# Patient Record
Sex: Male | Born: 1970
Health system: Southern US, Community
[De-identification: ages and names within clinical notes are randomized; demographics above are authoritative.]

## PROBLEM LIST (undated history)

## (undated) DIAGNOSIS — G4730 Sleep apnea, unspecified: Secondary | ICD-10-CM

## (undated) DIAGNOSIS — I10 Essential (primary) hypertension: Secondary | ICD-10-CM

## (undated) DIAGNOSIS — K219 Gastro-esophageal reflux disease without esophagitis: Secondary | ICD-10-CM

## (undated) DIAGNOSIS — B019 Varicella without complication: Secondary | ICD-10-CM

## (undated) DIAGNOSIS — M1990 Unspecified osteoarthritis, unspecified site: Secondary | ICD-10-CM

## (undated) DIAGNOSIS — N189 Chronic kidney disease, unspecified: Secondary | ICD-10-CM

## (undated) DIAGNOSIS — E119 Type 2 diabetes mellitus without complications: Secondary | ICD-10-CM

## (undated) DIAGNOSIS — E785 Hyperlipidemia, unspecified: Secondary | ICD-10-CM

## (undated) HISTORY — DX: Sleep apnea, unspecified: G47.30

## (undated) HISTORY — DX: Varicella without complication: B01.9

## (undated) HISTORY — DX: Chronic kidney disease, unspecified: N18.9

## (undated) HISTORY — PX: DENTAL SURGERY: SHX609

## (undated) HISTORY — DX: Unspecified osteoarthritis, unspecified site: M19.90

## (undated) HISTORY — DX: Gastro-esophageal reflux disease without esophagitis: K21.9

## (undated) HISTORY — DX: Type 2 diabetes mellitus without complications: E11.9

## (undated) HISTORY — DX: Hyperlipidemia, unspecified: E78.5

---

## 2000-10-29 ENCOUNTER — Encounter: Payer: Self-pay | Admitting: Occupational Medicine

## 2000-10-29 ENCOUNTER — Encounter: Admission: RE | Admit: 2000-10-29 | Discharge: 2000-10-29 | Payer: Self-pay | Admitting: Occupational Medicine

## 2004-05-02 ENCOUNTER — Ambulatory Visit (HOSPITAL_COMMUNITY): Admission: RE | Admit: 2004-05-02 | Discharge: 2004-05-02 | Payer: Self-pay | Admitting: Internal Medicine

## 2004-05-24 ENCOUNTER — Ambulatory Visit: Admission: RE | Admit: 2004-05-24 | Discharge: 2004-05-24 | Payer: Self-pay | Admitting: Internal Medicine

## 2004-05-24 ENCOUNTER — Encounter (INDEPENDENT_AMBULATORY_CARE_PROVIDER_SITE_OTHER): Payer: Self-pay | Admitting: *Deleted

## 2006-03-23 ENCOUNTER — Emergency Department (HOSPITAL_COMMUNITY): Admission: EM | Admit: 2006-03-23 | Discharge: 2006-03-23 | Payer: Self-pay | Admitting: Emergency Medicine

## 2006-09-20 ENCOUNTER — Emergency Department (HOSPITAL_COMMUNITY): Admission: EM | Admit: 2006-09-20 | Discharge: 2006-09-20 | Payer: Self-pay | Admitting: Emergency Medicine

## 2009-06-07 ENCOUNTER — Emergency Department (HOSPITAL_COMMUNITY): Admission: EM | Admit: 2009-06-07 | Discharge: 2009-06-07 | Payer: Self-pay | Admitting: Family Medicine

## 2011-02-01 NOTE — Op Note (Signed)
NAME:  Bryan Webb, Bryan Webb                          ACCOUNT NO.:  [id number]   MEDICAL RECORD NO.:  [id number]                   PATIENT TYPE:  OUT   LOCATION:  CARD                                 FACILITY:  [facility]   PHYSICIAN:  Legrand Como B. Melvyn Novas, M.D. [facility]           DATE OF BIRTH:  [birth year]   DATE OF PROCEDURE:  05/24/2004  DATE OF DISCHARGE:                                 OPERATIVE REPORT   REFERRING PHYSICIAN:  Dr. Shawna Orleans   PROCEDURE:  Fiberoptic bronchoscopy with random transbronchial biopsies of  the left lower lobe.   HISTORY AND INDICATIONS:  This is a 40 year old black male with a 3-4 month  history of cough associated with classic sarcoid changes on chest x-ray.  Bronchoscopy was felt to be indicated to obtain a tissue diagnosis if  possible before initiating prednisone therapy.  The patient agreed to the  procedure after full discussion of risks, benefits, and alternatives.   The procedure was performed in the bronchoscopy suite with continuous  monitoring by surface ECG and oximetry.  The patient maintained adequate  saturations throughout the procedure in sinus rhythm.  He received a total  of 100 mg of IV Demerol and 7.5 mg of IV Versed for adequate sedation and  cough suppression.   The right naris and oropharynx were liberally anesthetized with 1% lidocaine  by updraft nebulizer and the right naris additionally prepared with 2%  lidocaine jelly.  However, had a great deal of difficulty controlling the  coughing from the upper airway, even with additional direct administration  of lidocaine onto the epiglottis and vocal cords via a standard flexible  fiberoptic bronchoscope.  Nevertheless, we were able to pass the scope into  the airways with the following findings:  There was diffuse mild to moderate  cobblestoning and erythema of the mucosa, typical of sarcoid.  This was  worse in the upper lobes than the lower lobes, worse on the left than the  right.   DESCRIPTION OF  PROCEDURE:  Using a standard forceps technique and a wedged  position, 6 adequate transbronchial biopsies were obtained from the left  lower lobe randomly.  Adequate tissue was obtained.  Additionally, the  lingula was lavaged for AFB and fungal stain and culture as well as PCP to  be complete.   The patient tolerated the procedure well and had a follow up chest x-ray.   IMPRESSION:  Airway changes are classic for sarcoid which is the most likely  diagnosis here.   RECOMMENDATIONS:  Await tissue confirmation.   Because he had such severe coughing, both before and after the procedure, I  recommend a single dose of Solu-Medrol 125 mg IV but will arrange to see him  in the office within the next week to consider initiation of long-term  prednisone therapy.  Christena Deem. Melvyn Novas, M.D. [facility]   MBW/MEDQ  D:  05/24/2004  T:  05/24/2004  Job:  [id number]   cc:   Drema Pry, D.O. [facility]

## 2011-05-19 ENCOUNTER — Inpatient Hospital Stay (INDEPENDENT_AMBULATORY_CARE_PROVIDER_SITE_OTHER)
Admission: RE | Admit: 2011-05-19 | Discharge: 2011-05-19 | Disposition: A | Discharge status: Home / Self Care | Payer: BC Managed Care – PPO | Source: Ambulatory Visit | Attending: Family Medicine | Admitting: Family Medicine

## 2011-05-19 DIAGNOSIS — M67919 Unspecified disorder of synovium and tendon, unspecified shoulder: Secondary | ICD-10-CM

## 2011-05-19 DIAGNOSIS — M719 Bursopathy, unspecified: Secondary | ICD-10-CM

## 2012-01-06 ENCOUNTER — Emergency Department (INDEPENDENT_AMBULATORY_CARE_PROVIDER_SITE_OTHER)
Admission: EM | Admit: 2012-01-06 | Discharge: 2012-01-06 | Disposition: A | Discharge status: Home / Self Care | Payer: BC Managed Care – PPO | Source: Home / Self Care | Attending: Emergency Medicine | Admitting: Emergency Medicine

## 2012-01-06 ENCOUNTER — Encounter (HOSPITAL_COMMUNITY): Payer: Self-pay | Admitting: Emergency Medicine

## 2012-01-06 DIAGNOSIS — I10 Essential (primary) hypertension: Secondary | ICD-10-CM

## 2012-01-06 HISTORY — DX: Essential (primary) hypertension: I10

## 2012-01-06 LAB — POCT I-STAT, CHEM 8
BUN: 10 mg/dL (ref 6–23)
Calcium, Ion: 1.26 mmol/L (ref 1.12–1.32)
Chloride: 103 mEq/L (ref 96–112)
Creatinine, Ser: 1.4 mg/dL — ABNORMAL HIGH (ref 0.50–1.35)
Glucose, Bld: 117 mg/dL — ABNORMAL HIGH (ref 70–99)
HCT: 45 % (ref 39.0–52.0)
Hemoglobin: 15.3 g/dL (ref 13.0–17.0)
Potassium: 4.4 mEq/L (ref 3.5–5.1)
Sodium: 139 mEq/L (ref 135–145)
TCO2: 28 mmol/L (ref 0–100)

## 2012-01-06 MED ORDER — AMLODIPINE BESYLATE 10 MG PO TABS: 5.0000 mg | ORAL_TABLET | Freq: Every day | ORAL | Status: DC

## 2012-01-06 NOTE — ED Notes (Signed)
PT HERE WITH C/O CONSTANT FRONTAL H/A WITH FLUCTUATING BP'S FOR THE PAST 3 DYS AT HOME READING 176/98,158/76, AND 150/83.DENIES N/V/ OR FEELING FAINT.PT STATES HE HAS NEVER BEEN ON BP MEDS AND NEEDS REFERRAL FOR PCP.BP THIS ADMISSION 143/93

## 2012-01-06 NOTE — Discharge Instructions (Signed)
Call [phone] for primary care referral.  Blood pressure over the ideal can put you at higher risk for stroke, heart disease, and kidney failure.  For this reason, it's important to try to get your blood pressure as close as possible to the ideal.  The ideal blood pressure is 120/80.  Blood pressures from 123456 systolic over [id number] diastolic are labeled as "prehypertension."  This means you are at higher risk of developing hypertension in the future.  Blood pressures in this range are not treated with medication, but lifestyle changes are recommended to prevent progression to hypertension.  Blood pressures of [id number] and above systolic over 90 and above diastolic are classified as hypertension and are treated with medications.  Lifestyle changes which can benefit both prehypertension and hypertension include the following:   Salt and sodium restriction.  Weight loss.  Regular exercise.  Avoidance of tobacco.  Avoidance of excess alcohol.  The "D.A.S.H" diet.   People with hypertension and prehypertension should limit their salt intake to less than 1500 mg daily.  Reading the nutrition information on the label of many prepared foods can give you an idea of how much sodium you're consuming at each meal.  Remember that the most important number on the nutrition information is the serving size.  It may be smaller than you think.  Try to avoid adding extra salt at the table.  You may add small amounts of salt while cooking.  Remember that salt is an acquired taste and you may get used to a using a whole lot less salt than you are using now.  Using less salt lets the food's natural flavors come through.  You might want to consider using salt substitutes, potassium chloride, pepper, or blends of herbs and spices to enhance the flavor of your food.  Foods that contain the most salt include: processed meats (like ham, bacon, lunch meat, sausage, hot dogs, and breakfast meat), chips, pretzels, salted nuts,  soups, salty snacks, canned foods, junk food, fast food, restaurant food, mustard, pickles, pizza, popcorn, soy sauce, and worcestershire sauce--quite a list!  You might ask, "Is there anything I can eat?"  The answer is, "yes."  Fruits and vegetables are usually low in salt.  Fresh is better than frozen which is better than canned.  If you have canned vegetables, you can cut down on the salt content by rinsing them in tap water 3 times before cooking.     Weight loss is the second thing you can do to lower your blood pressure.  Getting to and maintaining ideal weight will often normalize your blood pressure and allow you to avoid medications, entirely, cut way down on your dosage of medications, or allow to wean off your meds.  (Note, this should only be done under the supervision of your primary care doctor.)  Of course, weight loss takes time and you may need to be on medication in the meantime.  You shoot for a body mass index of 20-25.  When you go to the urgent care or to your primary care doctor, they should calculate your BMI.  If you don't know what it is, ask.  You can calculate your BMI with the following formula:  Weight in pounds x 703/ (height in inches) x (height in inches).  There are many good diets out there: Weight Watchers and the D.A.S.H. Diet are the best, but often, just modifying a few factors can be helpful:  Don't skip meals, don't eat out, and keeping a  food diary.  I do not recommend fad diets or diet pills which often raise blood pressure.    Everyone should get regular exercise, but this is particularly important for people with high blood pressure.  Just about any exercise is good.  The only exercise which may be harmful is lifting extreme heavy weights.  I recommend moderate exercise such as walking for 30 minutes 5 days a week.  Going to the gym for a 50 minute workout 3 times a week is also good.  This amounts to 150 minutes of exercise weekly.   Anyone with high blood  pressure should avoid any use of tobacco.  Tobacco use does not elevate blood pressure, but it increases the risk of heart disease and stroke.  If you are interested in quitting, discuss with your doctor how to quit.  If you are not interested in quitting, ask yourself, "What would my life be like in 10 years if I continue to smoke?"  "How will I know when it is time to quit?"  "How would my life be better if I were to quit."   Excess alcohol intake can raise the blood pressure.  The safe alcohol intake is 2 drinks or less per day for men and 1 drink per day or less for women.   There is a very good diet which I recommend that has been designed for people with blood pressure called the D.A.S.H. Diet (dietary approaches to stop hypertension).  It consists of fruits, vegetables, lean meats, low fat dairy, whole grains, nuts and seeds.  It is very low in salt and sodium.  It has also been found to have other beneficial health effects such as lowering cholesterol and helping lose weight.  It has been developed by the [employer] and can be downloaded from the internet without any cost. Just do a Development worker, community on "D.A.S.H. Diet." or go the NIH website (MasterBoxes.it).  There are also cookbooks and diet plans that can be gotten from [place] to help you with this diet.

## 2012-01-06 NOTE — ED Provider Notes (Signed)
Chief Complaint  Patient presents with  . Blood Pressure Check    History of Present Illness:  Bryan Webb is a 41 year old male who comes in today because of high blood pressure. He's had his blood pressure checked multiple times throughout the past several years because there is a positive family history. So his been somewhat elevated. Over the past 3 days his blood pressure was 176/98, 150/83, and 161/91 respectively. He denies headaches, dizziness, blurry vision, chest pain, tightness, pressure, palpitations, presyncope, syncope, or shortness of breath. He's had no pedal edema or history of diabetes, elevated cholesterol, or kidney disease. He tries to watch his salt intake. He currently smokes daily, but will plan to quit.  Review of Systems:  Other than noted above, the patient denies any of the following symptoms: Systemic:  No fever, chills, sweats, fatigue, or weight loss or gain. Eye:  No diplopia or blurry vision. Lungs:  No cough, wheezing, or shortness of breath. Heart:  No chest pain, tightness, pressure, palpitations, rapid heartbeat, dizziness, syncope, ankle edema, PND or orthopnea. Neuro:  No headache, vertigo, paresthesias, weakness, ataxia, or slurred speech.  [place]:  Past medical history, family history, social history, meds, and allergies were reviewed.  Physical Exam:   Vital signs:  BP 143/93  Pulse 90  Temp(Src) 98.7 F (37.1 C) (Oral)  Resp 14  SpO2 100% General:  Alert, oriented, in no distress. Lungs:  Breath sounds clear and equal bilaterally.  No wheezes, rales or rhonchi. Heart:  Regular rhythm, no gallops, murmers, clicks or rubs. Abdomen:  Soft and flat  Labs:   Results for orders placed during the hospital encounter of 01/06/12  POCT I-STAT, CHEM 8      Component Value Range   Sodium 139  135 - 145 (mEq/L)   Potassium 4.4  3.5 - 5.1 (mEq/L)   Chloride 103  96 - 112 (mEq/L)   BUN 10  6 - 23 (mg/dL)   Creatinine, Ser 1.40 (*) 0.50 - 1.35 (mg/dL)   Glucose, Bld 117 (*) 70 - 99 (mg/dL)   Calcium, Ion 1.26  1.12 - 1.32 (mmol/L)   TCO2 28  0 - 100 (mmol/L)   Hemoglobin 15.3  13.0 - 17.0 (g/dL)   HCT 45.0  39.0 - 52.0 (%)     Assessment:   Diagnoses that have been ruled out:  None  Diagnoses that are still under consideration:  None  Final diagnoses:  Essential hypertension    Plan:   1.  The following meds were prescribed:   New Prescriptions   AMLODIPINE (NORVASC) 10 MG TABLET    Take 0.5 tablets (5 mg total) by mouth daily.   2.  The patient was instructed in symptomatic care and handouts were given. 3.  The patient was told to return if becoming worse in any way, if no better in 3 or 4 days, and given some red flag symptoms that would indicate earlier return. I discussed salt and sodium restriction and also smoking cessation with him. I also informed him about his creatinine being 1.4 and told him he would need a followup for this. I suggested he follow up with her primary care physician within the next 2 weeks.    Harden Mo, MD 01/06/12 [phone]

## 2016-08-12 ENCOUNTER — Encounter (HOSPITAL_COMMUNITY): Payer: Self-pay | Admitting: Emergency Medicine

## 2016-08-12 ENCOUNTER — Emergency Department (HOSPITAL_COMMUNITY)
Admission: EM | Admit: 2016-08-12 | Discharge: 2016-08-13 | Disposition: A | Discharge status: Home / Self Care | Payer: BLUE CROSS/BLUE SHIELD | Attending: Emergency Medicine | Admitting: Emergency Medicine

## 2016-08-12 DIAGNOSIS — F17200 Nicotine dependence, unspecified, uncomplicated: Secondary | ICD-10-CM | POA: Insufficient documentation

## 2016-08-12 DIAGNOSIS — Z794 Long term (current) use of insulin: Secondary | ICD-10-CM | POA: Diagnosis not present

## 2016-08-12 DIAGNOSIS — R739 Hyperglycemia, unspecified: Secondary | ICD-10-CM

## 2016-08-12 DIAGNOSIS — I10 Essential (primary) hypertension: Secondary | ICD-10-CM | POA: Insufficient documentation

## 2016-08-12 DIAGNOSIS — E1165 Type 2 diabetes mellitus with hyperglycemia: Secondary | ICD-10-CM | POA: Diagnosis not present

## 2016-08-12 LAB — URINALYSIS, ROUTINE W REFLEX MICROSCOPIC
Bilirubin Urine: NEGATIVE
Hgb urine dipstick: NEGATIVE
KETONES UR: NEGATIVE mg/dL
LEUKOCYTES UA: NEGATIVE
Nitrite: NEGATIVE
PROTEIN: NEGATIVE mg/dL
Specific Gravity, Urine: 1.037 — ABNORMAL HIGH (ref 1.005–1.030)
pH: 5.5 (ref 5.0–8.0)

## 2016-08-12 LAB — BASIC METABOLIC PANEL
Anion gap: 8 (ref 5–15)
BUN: 22 mg/dL — AB (ref 6–20)
CALCIUM: 9.8 mg/dL (ref 8.9–10.3)
CO2: 27 mmol/L (ref 22–32)
CREATININE: 1.51 mg/dL — AB (ref 0.61–1.24)
Chloride: 95 mmol/L — ABNORMAL LOW (ref 101–111)
GFR, EST NON AFRICAN AMERICAN: 54 mL/min — AB (ref >60)
Glucose, Bld: 606 mg/dL (ref 65–99)
Potassium: 4.5 mmol/L (ref 3.5–5.1)
SODIUM: 130 mmol/L — AB (ref 135–145)

## 2016-08-12 LAB — CBG MONITORING, ED
GLUCOSE-CAPILLARY: 429 mg/dL — AB (ref 65–99)
GLUCOSE-CAPILLARY: 255 mg/dL — AB (ref 65–99)
Glucose-Capillary: 374 mg/dL — ABNORMAL HIGH (ref 65–99)

## 2016-08-12 LAB — URINE MICROSCOPIC-ADD ON
Bacteria, UA: NONE SEEN
RBC / HPF: NONE SEEN RBC/hpf (ref 0–5)
Squamous Epithelial / LPF: NONE SEEN
WBC UA: NONE SEEN WBC/hpf (ref 0–5)

## 2016-08-12 LAB — CBC
HCT: 40.1 % (ref 39.0–52.0)
Hemoglobin: 13.8 g/dL (ref 13.0–17.0)
MCH: 30.7 pg (ref 26.0–34.0)
MCHC: 34.4 g/dL (ref 30.0–36.0)
MCV: 89.3 fL (ref 78.0–100.0)
PLATELETS: 244 10*3/uL (ref 150–400)
RBC: 4.49 MIL/uL (ref 4.22–5.81)
RDW: 12.7 % (ref 11.5–15.5)
WBC: 4.6 10*3/uL (ref 4.0–10.5)

## 2016-08-12 MED ORDER — INSULIN ASPART 100 UNIT/ML ~~LOC~~ SOLN
6.0000 [IU] | Freq: Once | SUBCUTANEOUS | Status: AC
  Administered 2016-08-12: 6 [IU] via INTRAVENOUS
  Filled 2016-08-12: qty 1

## 2016-08-12 MED ORDER — INSULIN GLARGINE 100 UNIT/ML ~~LOC~~ SOLN: 10.0000 [IU] | Freq: Every day | SUBCUTANEOUS | 0 refills | Status: DC

## 2016-08-12 MED ORDER — SODIUM CHLORIDE 0.9 % IV BOLUS (SEPSIS)
1000.0000 mL | Freq: Once | INTRAVENOUS | Status: AC
  Administered 2016-08-12: 1000 mL via INTRAVENOUS

## 2016-08-12 MED ORDER — INSULIN GLARGINE 100 UNIT/ML ~~LOC~~ SOLN
10.0000 [IU] | Freq: Once | SUBCUTANEOUS | Status: AC
  Administered 2016-08-12: 10 [IU] via SUBCUTANEOUS
  Filled 2016-08-12: qty 0.1

## 2016-08-12 NOTE — Discharge Instructions (Signed)
Follow-up with a primary care doctor. °

## 2016-08-12 NOTE — ED Triage Notes (Signed)
Pt was sent in by the RN at his work stating that his CBG is still reading high even after being on metformin for several months. Alert and oriented.

## 2016-08-12 NOTE — ED Notes (Signed)
ED Provider at bedside. 

## 2016-08-12 NOTE — ED Provider Notes (Signed)
[place] DEPT Provider Note   CSN: [id number] Arrival date & time: 08/12/16  1609     History   Chief Complaint Chief Complaint  Patient presents with  . Hyperglycemia    HPI Bryan Webb is a 45 y.o. male.  HPI Patient presents after being sent in for high blood sugar. He had his annual physical at work a few days ago and found her sugar 400. Hemoglobin A1c of greater than 15.5. History of diabetes and is on metformin. States he cut his dose down from 1000 down to 500 because it was giving him GI issues. States he is urinating a lot at night. He is thirsty all the time. No fevers. No chest pain. States he feels fine otherwise. Does have urinary frequency. No chest pain. No lightheadedness dizziness. No weight loss. No abdominal pain. No rashes.   Past Medical History:  Diagnosis Date  . Hypertension     There are no active problems to display for this patient.   History reviewed. No pertinent surgical history.     Home Medications    Prior to Admission medications   Medication Sig Start Date End Date Taking? Authorizing Provider  albuterol (PROVENTIL HFA;VENTOLIN HFA) 108 (90 Base) MCG/ACT inhaler Inhale 2 puffs into the lungs every 6 (six) hours as needed for wheezing or shortness of breath.   Yes Historical Provider, MD  furosemide (LASIX) 40 MG tablet Take 40 mg by mouth daily as needed for fluid.   Yes Historical Provider, MD  losartan (COZAAR) 100 MG tablet Take 100 mg by mouth daily. 06/14/16  Yes Historical Provider, MD  metFORMIN (GLUMETZA) 500 MG (MOD) 24 hr tablet Take 500 mg by mouth at bedtime.   Yes Historical Provider, MD  metoprolol succinate (TOPROL-XL) 100 MG 24 hr tablet Take 100 mg by mouth 2 (two) times daily. 07/10/16  Yes Historical Provider, MD  insulin glargine (LANTUS) 100 UNIT/ML injection Inject 0.1 mLs (10 Units total) into the skin at bedtime. Increase by 2 units the next night if the sugar is greater than 200 in the morning. Decrease  by 2 units if it is less than 100 in the morning. 08/12/16   Davonna Belling, MD    Family History History reviewed. No pertinent family history.  Social History Social History  Substance Use Topics  . Smoking status: Current Every Day Smoker  . Smokeless tobacco: Not on file  . Alcohol use No     Allergies   Patient has no known allergies.   Review of Systems Review of Systems  Constitutional: Negative for activity change and appetite change.  HENT: Negative for congestion and ear discharge.   Eyes: Negative for photophobia.  Respiratory: Negative for chest tightness and shortness of breath.   Cardiovascular: Negative for chest pain and leg swelling.  Gastrointestinal: Negative for abdominal pain and nausea.  Endocrine: Positive for polydipsia and polyuria.  Genitourinary: Negative for flank pain.  Musculoskeletal: Negative for back pain.  Skin: Negative for rash.  Neurological: Negative for weakness, numbness and headaches.  Psychiatric/Behavioral: Negative for behavioral problems.     Physical Exam Updated Vital Signs BP 149/96   Pulse 61   Temp 98.9 F (37.2 C)   Resp 19   Ht 5\' 8"  (1.727 m)   Wt 210 lb (95.3 kg)   SpO2 96%   BMI 31.93 kg/m   Physical Exam  Constitutional: He appears well-developed.  HENT:  Head: Atraumatic.  Eyes: EOM are normal.  Neck: Neck supple.  Cardiovascular: Normal rate.   Pulmonary/Chest: Effort normal.  Abdominal: Soft.  Musculoskeletal: Normal range of motion.  Neurological: He is alert.  Skin: Skin is warm.  Psychiatric: He has a normal mood and affect.     ED Treatments / Results  Labs (all labs ordered are listed, but only abnormal results are displayed) Labs Reviewed  BASIC METABOLIC PANEL - Abnormal; Notable for the following:       Result Value   Sodium 130 (*)    Chloride 95 (*)    Glucose, Bld 606 (*)    BUN 22 (*)    Creatinine, Ser 1.51 (*)    GFR calc non Af Amer 54 (*)    All other components  within normal limits  URINALYSIS, ROUTINE W REFLEX MICROSCOPIC (NOT AT [facility]) - Abnormal; Notable for the following:    Specific Gravity, Urine 1.037 (*)    Glucose, UA >1000 (*)    All other components within normal limits  CBG MONITORING, ED - Abnormal; Notable for the following:    Glucose-Capillary >600 (*)    All other components within normal limits  CBG MONITORING, ED - Abnormal; Notable for the following:    Glucose-Capillary 429 (*)    All other components within normal limits  CBG MONITORING, ED - Abnormal; Notable for the following:    Glucose-Capillary 374 (*)    All other components within normal limits  CBG MONITORING, ED - Abnormal; Notable for the following:    Glucose-Capillary 255 (*)    All other components within normal limits  CBC  URINE MICROSCOPIC-ADD ON    EKG  EKG Interpretation None       Radiology No results found.  Procedures Procedures (including critical care time)  Medications Ordered in ED Medications  sodium chloride 0.9 % bolus 1,000 mL (0 mLs Intravenous Stopped 08/12/16 1827)  sodium chloride 0.9 % bolus 1,000 mL (0 mLs Intravenous Stopped 08/12/16 2012)  insulin aspart (novoLOG) injection 6 Units (6 Units Intravenous Given 08/12/16 1852)  insulin aspart (novoLOG) injection 6 Units (6 Units Intravenous Given 08/12/16 2040)  sodium chloride 0.9 % bolus 1,000 mL (0 mLs Intravenous Stopped 08/12/16 2331)  insulin glargine (LANTUS) injection 10 Units (10 Units Subcutaneous Given 08/12/16 2330)     Initial Impression / Assessment and Plan / ED Course  I have reviewed the triage vital signs and the nursing notes.  Pertinent labs & imaging results that were available during my care of the patient were reviewed by me and considered in my medical decision making (see chart for details).  Clinical Course     Patient found to be hyperglycemic. Is not in DKA. Discussed curbside with internal medicine will add Lantus. Instructed on how to do  injections by nursing. Patient's family member was testing supplies. Written for injection supplies and testing supplies. Will need to follow-up with primary care doctor.  Final Clinical Impressions(s) / ED Diagnoses   Final diagnoses:  Hyperglycemia    New Prescriptions New Prescriptions   INSULIN GLARGINE (LANTUS) 100 UNIT/ML INJECTION    Inject 0.1 mLs (10 Units total) into the skin at bedtime. Increase by 2 units the next night if the sugar is greater than 200 in the morning. Decrease by 2 units if it is less than 100 in the morning.     Davonna Belling, MD 08/13/16 [phone]

## 2016-08-21 ENCOUNTER — Encounter: Payer: Self-pay | Admitting: Family

## 2016-08-21 ENCOUNTER — Ambulatory Visit (INDEPENDENT_AMBULATORY_CARE_PROVIDER_SITE_OTHER): Payer: BLUE CROSS/BLUE SHIELD | Admitting: Family

## 2016-08-21 ENCOUNTER — Other Ambulatory Visit (INDEPENDENT_AMBULATORY_CARE_PROVIDER_SITE_OTHER): Payer: BLUE CROSS/BLUE SHIELD

## 2016-08-21 DIAGNOSIS — E119 Type 2 diabetes mellitus without complications: Secondary | ICD-10-CM | POA: Insufficient documentation

## 2016-08-21 DIAGNOSIS — E1165 Type 2 diabetes mellitus with hyperglycemia: Secondary | ICD-10-CM

## 2016-08-21 LAB — HEMOGLOBIN A1C: Hgb A1c MFr Bld: 15.5 % — ABNORMAL HIGH (ref 4.6–6.5)

## 2016-08-21 LAB — MICROALBUMIN / CREATININE URINE RATIO
Creatinine,U: 133.1 mg/dL
MICROALB UR: 2.7 mg/dL — AB (ref 0.0–1.9)
MICROALB/CREAT RATIO: 2 mg/g (ref 0.0–30.0)

## 2016-08-21 MED ORDER — DULAGLUTIDE 0.75 MG/0.5ML ~~LOC~~ SOAJ: 0.7500 mg | SUBCUTANEOUS | 0 refills | Status: DC

## 2016-08-21 NOTE — Progress Notes (Signed)
Subjective:    Patient ID: Bryan Webb, male    DOB: 08-02-1971, 45 y.o.   MRN: [id number]  Chief Complaint  Patient presents with  . Establish Care    diabetes? needs a1c check dieting advise    HPI:  Bryan Webb is a 45 y.o. male who  has a past medical history of Chicken pox; Diabetes mellitus without complication ([place]); Hyperlipidemia; and Hypertension. and presents today for an office visit to establish care.   1.) Hyperglycemia - Recently evaluated in the Emergency Department with the chief concern of hyperglycemia that was noted during an annual physical at work with a blood sugar of 400. His A1c at the time at was 15.5. Did have a previous history of diabetes and was on metformin. Did have some GI side effects at the 1000 mg level. He was experiencing polydipsia and polyuria at the time. No DKA was noted. He was started on Lantus at 10 units. Curently taking the metformin and Lantus on 18 units of Lantus  as prescribed and denies adverse side effects. Polydipsia and polyuria have been resolved. No changes in vision. Blood sugars at home have been 196-300 at various times of the day.   No Known Allergies    Outpatient Medications Prior to Visit  Medication Sig Dispense Refill  . albuterol (PROVENTIL HFA;VENTOLIN HFA) 108 (90 Base) MCG/ACT inhaler Inhale 2 puffs into the lungs every 6 (six) hours as needed for wheezing or shortness of breath.    . furosemide (LASIX) 40 MG tablet Take 40 mg by mouth daily as needed for fluid.    Marland Kitchen insulin glargine (LANTUS) 100 UNIT/ML injection Inject 0.1 mLs (10 Units total) into the skin at bedtime. Increase by 2 units the next night if the sugar is greater than 200 in the morning. Decrease by 2 units if it is less than 100 in the morning. (Patient taking differently: Inject 18 Units into the skin at bedtime. Increase by 2 units the next night if the sugar is greater than 200 in the morning. Decrease by 2 units if it is less than 100 in the  morning.) 10 mL 0  . losartan (COZAAR) 100 MG tablet Take 100 mg by mouth daily.  0  . metFORMIN (GLUMETZA) 500 MG (MOD) 24 hr tablet Take 500 mg by mouth at bedtime.    . metoprolol succinate (TOPROL-XL) 100 MG 24 hr tablet Take 100 mg by mouth 2 (two) times daily.  0   No facility-administered medications prior to visit.      Past Medical History:  Diagnosis Date  . Chicken pox   . Diabetes mellitus without complication ([place])   . Hyperlipidemia   . Hypertension       History reviewed. No pertinent surgical history.    Family History  Problem Relation Age of Onset  . Hypertension Mother   . Hypertension Father   . Hypertension Maternal Grandmother   . Hypertension Maternal Grandfather   . Hypertension Paternal Grandmother   . Diabetes Paternal Grandmother   . Hypertension Paternal Grandfather      Social History   Social History  . Marital status: Single    Spouse name: N/A  . Number of children: 4  . Years of education: 12   Occupational History  . Producer, television/film/video   . Bus Driver    Social History Main Topics  . Smoking status: Former Smoker    Packs/day: 1.00    Years: 20.00  . Smokeless  tobacco: Never Used  . Alcohol use No  . Drug use: No  . Sexual activity: Not on file   Other Topics Concern  . Not on file   Social History Narrative   Fun: Always working, but likes to relax    Review of Systems  Constitutional: Negative for chills and fever.  Eyes:       Negative for changes in vision.  Respiratory: Negative for chest tightness and shortness of breath.   Cardiovascular: Negative for chest pain, palpitations and leg swelling.  Endocrine: Negative for polydipsia, polyphagia and polyuria.  Neurological: Negative for dizziness, weakness, light-headedness and headaches.       Objective:    BP (!) 154/94 (BP Location: Left Arm, Patient Position: Sitting, Cuff Size: Large)   Pulse (!) 56   Temp 98.3 F (36.8 C) (Oral)   Resp 16   Ht 5'  8" (1.727 m)   Wt 213 lb (96.6 kg)   SpO2 98%   BMI 32.39 kg/m  Nursing note and vital signs reviewed.  Physical Exam  Constitutional: He is oriented to person, place, and time. He appears well-developed and well-nourished. No distress.  Cardiovascular: Normal rate, regular rhythm, normal heart sounds and intact distal pulses.   Pulmonary/Chest: Effort normal and breath sounds normal.  Neurological: He is alert and oriented to person, place, and time.  Diabetic foot exam - bilateral feet are free from skin breakdown, cuts, and abrasions. Toenails are neatly trimmed. Pulses are intact and appropriate. Sensation is intact to monofilament bilaterally.  Skin: Skin is warm and dry.  Psychiatric: He has a normal mood and affect. His behavior is normal. Judgment and thought content normal.        Assessment & Plan:   Problem List Items Addressed This Visit      Endocrine   Type 2 diabetes mellitus ([place])    Previous A1c of 15.5 noted during work exam consistent with uncontrolled diabetes with no current symptoms of end organ damage. Obtain hemoglobin A1c and urine microalbumin. Referral placed to diabetic education. Diabetic foot exam completed today. Encouraged to complete diabetic eye exam independently. Maintained on losartan for CAD risk reduction. Continue current dosage of metformin and Lantus. Increase Lantus 2 units for fasting blood sugars greater than 100. Start Trulicity with sample provided. Monitor blood sugars 1-2 times per day. Follow-up in one month or sooner pending A1c results.      Relevant Medications   Dulaglutide (TRULICITY) 3.00 PQ/3.3AQ SOPN   Other Relevant Orders   Hemoglobin A1c (Completed)   Urine Microalbumin w/creat. ratio (Completed)   Ambulatory referral to diabetic education      I am having Mr. Sees start on Dulaglutide. I am also having him maintain his losartan, metoprolol succinate, albuterol, furosemide, metFORMIN, and insulin glargine.   Meds  ordered this encounter  Medications  . Dulaglutide (TRULICITY) 7.62 UQ/3.3HL SOPN    Sig: Inject 0.75 mg into the skin once a week.    Dispense:  4 pen    Refill:  0    Order Specific Question:   Supervising Provider    Answer:   Pricilla Holm A [[id number]]    30 minutes of face-to-face time was spent with the patient with 20 minutes discussing / counseling regarding diagnosis of diabetes, exercise, medications, and nutrition management.    Follow-up: Return in about 1 month (around 09/21/2016).  Mauricio Po, FNP

## 2016-08-21 NOTE — Patient Instructions (Signed)
Thank you for choosing [facility].  SUMMARY AND INSTRUCTIONS:  Medication:  Please start the Trulicity weekly.  Your prescription(s) have been submitted to your pharmacy or been printed and provided for you. Please take as directed and contact our office if you believe you are having problem(s) with the medication(s) or have any questions.  Labs:  Please stop by the lab on the lower level of the building for your blood work. Your results will be released to [place] (or called to you) after review, usually within 72 hours after test completion. If any changes need to be made, you will be notified at that same time.  1.) The lab is open from 7:30am to 5:30 pm Monday-Friday 2.) No appointment is necessary 3.) Fasting (if needed) is 6-8 hours after food and drink; black coffee and water are okay   Follow up:  If your symptoms worsen or fail to improve, please contact our office for further instruction, or in case of emergency go directly to the emergency room at the closest medical facility.    DIABETES CARE INSTRUCTIONS:  Current A1c: No results found for: HGBA1C  A1C Goal: <7.0%  Fasting Blood Sugar Goal: 80-130 Blood sugar check that you take after fasting for at least 8 hours which is usually in the morning before eating.  Post-prandial Blood Sugar Goal: <180 Blood sugar check approximately 1-2 hours after the start of a meal.   Diabetes Prevention Screens:  1.) Ensure you have an annual eye exam by an ophthalmologist or optometrist.   2,) Ensure you have an annual foot exam or when any changes are noted including new onset numbness/tingling or wounds. Check your feet daily!  3.) The American Diabetes Association recommends the Pneumovax vaccination against pneumonia every 5 years.  4.) We will check your kidney function with a urine test on an annual basis.   Diabetes Mellitus and Exercise Exercising regularly is important for your overall health, especially  when you have diabetes (diabetes mellitus). Exercising is not only about losing weight. It has many health benefits, such as increasing muscle strength and bone density and reducing body fat and stress. This leads to improved fitness, flexibility, and endurance, all of which result in better overall health. Exercise has additional benefits for people with diabetes, including:  Reducing appetite.  Helping to lower and control blood glucose.  Lowering blood pressure.  Helping to control amounts of fatty substances (lipids) in the blood, such as cholesterol and triglycerides.  Helping the body to respond better to insulin (improving insulin sensitivity).  Reducing how much insulin the body needs.  Decreasing the risk for heart disease by:  Lowering cholesterol and triglyceride levels.  Increasing the levels of good cholesterol.  Lowering blood glucose levels. What is my activity plan? Your health care provider or certified diabetes educator can help you make a plan for the type and frequency of exercise (activity plan) that works for you. Make sure that you:  Do at least 150 minutes of moderate-intensity or vigorous-intensity exercise each week. This could be brisk walking, biking, or water aerobics.  Do stretching and strength exercises, such as yoga or weightlifting, at least 2 times a week.  Spread out your activity over at least 3 days of the week.  Get some form of physical activity every day.  Do not go more than 2 days in a row without some kind of physical activity.  Avoid being inactive for more than 90 minutes at a time. Take frequent breaks to  walk or stretch.  Choose a type of exercise or activity that you enjoy, and set realistic goals.  Start slowly, and gradually increase the intensity of your exercise over time. What do I need to know about managing my diabetes?  Check your blood glucose before and after exercising.  If your blood glucose is higher than 240  mg/dL (13.3 mmol/L) before you exercise, check your urine for ketones. If you have ketones in your urine, do not exercise until your blood glucose returns to normal.  Know the symptoms of low blood glucose (hypoglycemia) and how to treat it. Your risk for hypoglycemia increases during and after exercise. Common symptoms of hypoglycemia can include:  Hunger.  Anxiety.  Sweating and feeling clammy.  Confusion.  Dizziness or feeling light-headed.  Increased heart rate or palpitations.  Blurry vision.  Tingling or numbness around the mouth, lips, or tongue.  Tremors or shakes.  Irritability.  Keep a rapid-acting carbohydrate snack available before, during, and after exercise to help prevent or treat hypoglycemia.  Avoid injecting insulin into areas of the body that are going to be exercised. For example, avoid injecting insulin into:  The arms, when playing tennis.  The legs, when jogging.  Keep records of your exercise habits. Doing this can help you and your health care provider adjust your diabetes management plan as needed. Write down:  Food that you eat before and after you exercise.  Blood glucose levels before and after you exercise.  The type and amount of exercise you have done.  When your insulin is expected to peak, if you use insulin. Avoid exercising at times when your insulin is peaking.  When you start a new exercise or activity, work with your health care provider to make sure the activity is safe for you, and to adjust your insulin, medicines, or food intake as needed.  Drink plenty of water while you exercise to prevent dehydration or heat stroke. Drink enough fluid to keep your urine clear or pale yellow. This information is not intended to replace advice given to you by your health care provider. Make sure you discuss any questions you have with your health care provider. Document Released: 11/23/2003 Document Revised: 03/22/2016 Document Reviewed:  02/12/2016 Elsevier Interactive Patient Education  2017 [place].  Diabetes Mellitus and Food It is important for you to manage your blood sugar (glucose) level. Your blood glucose level can be greatly affected by what you eat. Eating healthier foods in the appropriate amounts throughout the day at about the same time each day will help you control your blood glucose level. It can also help slow or prevent worsening of your diabetes mellitus. Healthy eating may even help you improve the level of your blood pressure and reach or maintain a healthy weight. General recommendations for healthful eating and cooking habits include:  Eating meals and snacks regularly. Avoid going long periods of time without eating to lose weight.  Eating a diet that consists mainly of plant-based foods, such as fruits, vegetables, nuts, legumes, and whole grains.  Using low-heat cooking methods, such as baking, instead of high-heat cooking methods, such as deep frying. Work with your dietitian to make sure you understand how to use the Nutrition Facts information on food labels. How can food affect me? Carbohydrates  Carbohydrates affect your blood glucose level more than any other type of food. Your dietitian will help you determine how many carbohydrates to eat at each meal and teach you how to count carbohydrates. Counting  carbohydrates is important to keep your blood glucose at a healthy level, especially if you are using insulin or taking certain medicines for diabetes mellitus. Alcohol  Alcohol can cause sudden decreases in blood glucose (hypoglycemia), especially if you use insulin or take certain medicines for diabetes mellitus. Hypoglycemia can be a life-threatening condition. Symptoms of hypoglycemia (sleepiness, dizziness, and disorientation) are similar to symptoms of having too much alcohol. If your health care provider has given you approval to drink alcohol, do so in moderation and use the following  guidelines:  Women should not have more than one drink per day, and men should not have more than two drinks per day. One drink is equal to:  12 oz of beer.  5 oz of wine.  1 oz of hard liquor.  Do not drink on an empty stomach.  Keep yourself hydrated. Have water, diet soda, or unsweetened iced tea.  Regular soda, juice, and other mixers might contain a lot of carbohydrates and should be counted. What foods are not recommended? As you make food choices, it is important to remember that all foods are not the same. Some foods have fewer nutrients per serving than other foods, even though they might have the same number of calories or carbohydrates. It is difficult to get your body what it needs when you eat foods with fewer nutrients. Examples of foods that you should avoid that are high in calories and carbohydrates but low in nutrients include:  Trans fats (most processed foods list trans fats on the Nutrition Facts label).  Regular soda.  Juice.  Candy.  Sweets, such as cake, pie, doughnuts, and cookies.  Fried foods. What foods can I eat? Eat nutrient-rich foods, which will nourish your body and keep you healthy. The food you should eat also will depend on several factors, including:  The calories you need.  The medicines you take.  Your weight.  Your blood glucose level.  Your blood pressure level.  Your cholesterol level. You should eat a variety of foods, including:  Protein.  Lean cuts of meat.  Proteins low in saturated fats, such as fish, egg whites, and beans. Avoid processed meats.  Fruits and vegetables.  Fruits and vegetables that may help control blood glucose levels, such as apples, mangoes, and yams.  Dairy products.  Choose fat-free or low-fat dairy products, such as milk, yogurt, and cheese.  Grains, bread, pasta, and rice.  Choose whole grain products, such as multigrain bread, whole oats, and brown rice. These foods may help control  blood pressure.  Fats.  Foods containing healthful fats, such as nuts, avocado, olive oil, canola oil, and fish. Does everyone with diabetes mellitus have the same meal plan? Because every person with diabetes mellitus is different, there is not one meal plan that works for everyone. It is very important that you meet with a dietitian who will help you create a meal plan that is just right for you. This information is not intended to replace advice given to you by your health care provider. Make sure you discuss any questions you have with your health care provider. Document Released: 05/30/2005 Document Revised: 02/08/2016 Document Reviewed: 07/30/2013 Elsevier Interactive Patient Education  2017 [employer].

## 2016-08-21 NOTE — Assessment & Plan Note (Signed)
Previous A1c of 15.5 noted during work exam consistent with uncontrolled diabetes with no current symptoms of end organ damage. Obtain hemoglobin A1c and urine microalbumin. Referral placed to diabetic education. Diabetic foot exam completed today. Encouraged to complete diabetic eye exam independently. Maintained on losartan for CAD risk reduction. Continue current dosage of metformin and Lantus. Increase Lantus 2 units for fasting blood sugars greater than 100. Start Trulicity with sample provided. Monitor blood sugars 1-2 times per day. Follow-up in one month or sooner pending A1c results.

## 2016-09-20 ENCOUNTER — Ambulatory Visit (INDEPENDENT_AMBULATORY_CARE_PROVIDER_SITE_OTHER): Payer: BLUE CROSS/BLUE SHIELD | Admitting: Family

## 2016-09-20 ENCOUNTER — Encounter: Payer: Self-pay | Admitting: Family

## 2016-09-20 ENCOUNTER — Other Ambulatory Visit (INDEPENDENT_AMBULATORY_CARE_PROVIDER_SITE_OTHER): Payer: BLUE CROSS/BLUE SHIELD

## 2016-09-20 VITALS — BP 152/90 | HR 60 | Temp 98.1°F | Resp 14 | Ht 68.0 in | Wt 212.0 lb

## 2016-09-20 DIAGNOSIS — E1165 Type 2 diabetes mellitus with hyperglycemia: Secondary | ICD-10-CM | POA: Diagnosis not present

## 2016-09-20 DIAGNOSIS — Z23 Encounter for immunization: Secondary | ICD-10-CM

## 2016-09-20 LAB — HEMOGLOBIN A1C: Hgb A1c MFr Bld: 12.5 % — ABNORMAL HIGH (ref 4.6–6.5)

## 2016-09-20 MED ORDER — DULAGLUTIDE 1.5 MG/0.5ML ~~LOC~~ SOAJ: 1.5000 mg | SUBCUTANEOUS | 2 refills | Status: DC

## 2016-09-20 NOTE — Patient Instructions (Addendum)
Thank you for choosing [facility].  SUMMARY AND INSTRUCTIONS:  Medication:  Please increase the Trulicity to 1.5 mg weekly.  Continue to take your other medications as prescribed.  Your prescription(s) have been submitted to your pharmacy or been printed and provided for you. Please take as directed and contact our office if you believe you are having problem(s) with the medication(s) or have any questions.  Labs:  Please stop by the lab on the lower level of the building for your blood work. Your results will be released to [place] (or called to you) after review, usually within 72 hours after test completion. If any changes need to be made, you will be notified at that same time.  1.) The lab is open from 7:30am to 5:30 pm Monday-Friday 2.) No appointment is necessary 3.) Fasting (if needed) is 6-8 hours after food and drink; black coffee and water are okay   Follow up:  If your symptoms worsen or fail to improve, please contact our office for further instruction, or in case of emergency go directly to the emergency room at the closest medical facility.   DIABETES CARE INSTRUCTIONS:  Current A1c:  Lab Results  Component Value Date   HGBA1C 15.5 Repeated and verified X2. (H) 08/21/2016    A1C Goal: <7.0%  Fasting Blood Sugar Goal: 80-130 Blood sugar check that you take after fasting for at least 8 hours which is usually in the morning before eating.  Post-prandial Blood Sugar Goal: <180 Blood sugar check approximately 1-2 hours after the start of a meal.    Diabetes Prevention Screens:  1.) Ensure you have an annual eye exam by an ophthalmologist or optometrist.   2,) Ensure you have an annual foot exam or when any changes are noted including new onset numbness/tingling or wounds. Check your feet daily!  3.) The American Diabetes Association recommends the Pneumovax vaccination against pneumonia every 5 years.  4.) We will check your kidney function with a  urine test on an annual basis.

## 2016-09-20 NOTE — Progress Notes (Signed)
Subjective:    Patient ID: Bryan Webb, male    DOB: 04/19/1971, 46 y.o.   MRN: [id number]  Chief Complaint  Patient presents with  . Follow-up    diabetes    HPI:  Bryan Webb is a 46 y.o. male who  has a past medical history of Chicken pox; Diabetes mellitus without complication ([place]); Hyperlipidemia; and Hypertension. and presents today for an office follow up.  Type 2 diabetes - Currently maintained Trulicity, metfformin and Lantus. Lantus ranges between 18 and 20 daily. Home blood sugars continue to fluctuate between 100-200 area. Denies numbness, tingling, or excessive hunger, thirst or urination.   Lab Results  Component Value Date   HGBA1C 12.5 (H) 09/20/2016    Lab Results  Component Value Date   CREATININE 1.51 (H) 08/12/2016   BUN 22 (H) 08/12/2016   NA 130 (L) 08/12/2016   K 4.5 08/12/2016   CL 95 (L) 08/12/2016   CO2 27 08/12/2016    No Known Allergies    Outpatient Medications Prior to Visit  Medication Sig Dispense Refill  . albuterol (PROVENTIL HFA;VENTOLIN HFA) 108 (90 Base) MCG/ACT inhaler Inhale 2 puffs into the lungs every 6 (six) hours as needed for wheezing or shortness of breath.    . furosemide (LASIX) 40 MG tablet Take 40 mg by mouth daily as needed for fluid.    Marland Kitchen insulin glargine (LANTUS) 100 UNIT/ML injection Inject 0.1 mLs (10 Units total) into the skin at bedtime. Increase by 2 units the next night if the sugar is greater than 200 in the morning. Decrease by 2 units if it is less than 100 in the morning. (Patient taking differently: Inject 18 Units into the skin at bedtime. Increase by 2 units the next night if the sugar is greater than 200 in the morning. Decrease by 2 units if it is less than 100 in the morning.) 10 mL 0  . losartan (COZAAR) 100 MG tablet Take 100 mg by mouth daily.  0  . metFORMIN (GLUMETZA) 500 MG (MOD) 24 hr tablet Take 500 mg by mouth at bedtime.    . metoprolol succinate (TOPROL-XL) 100 MG 24 hr tablet Take 100  mg by mouth 2 (two) times daily.  0  . Dulaglutide (TRULICITY) 3.78 HY/8.5OY SOPN Inject 0.75 mg into the skin once a week. 4 pen 0   No facility-administered medications prior to visit.      Review of Systems  Eyes:       Negative for changes in vision.  Respiratory: Negative for chest tightness and shortness of breath.   Cardiovascular: Negative for chest pain, palpitations and leg swelling.  Endocrine: Negative for polydipsia, polyphagia and polyuria.  Neurological: Negative for dizziness, weakness, light-headedness and headaches.      Objective:    BP (!) 152/90 (BP Location: Left Arm, Patient Position: Sitting, Cuff Size: Large)   Pulse 60   Temp 98.1 F (36.7 C) (Oral)   Resp 14   Ht 5\' 8"  (1.727 m)   Wt 212 lb (96.2 kg)   SpO2 97%   BMI 32.23 kg/m  Nursing note and vital signs reviewed.  Physical Exam  Constitutional: He is oriented to person, place, and time. He appears well-developed and well-nourished. No distress.  Cardiovascular: Normal rate, regular rhythm, normal heart sounds and intact distal pulses.   Pulmonary/Chest: Effort normal and breath sounds normal.  Neurological: He is alert and oriented to person, place, and time.  Skin: Skin is warm and  dry.  Psychiatric: He has a normal mood and affect. His behavior is normal. Judgment and thought content normal.       Assessment & Plan:   Problem List Items Addressed This Visit      Endocrine   Type 2 diabetes mellitus ([place])    Blood sugars appear with improved control but do remain elevated above goal. Obtain A1c to check for improvement. Pneumovax updated today. Continue current dosage of metformin and Lantus. Increase Trulicity. Continue to monitor blood sugars at home and follow up in 2 months or sooner if needed.       Relevant Medications   Dulaglutide (TRULICITY) 1.5 OI/5.1GF SOPN   Other Relevant Orders   Hemoglobin A1c (Completed)    Other Visit Diagnoses    Need for 23-polyvalent  pneumococcal polysaccharide vaccine    -  Primary   Relevant Orders   Pneumococcal polysaccharide vaccine 23-valent greater than or equal to 2yo subcutaneous/IM (Completed)       I have discontinued Mr. Wehrly Dulaglutide. I am also having him start on Dulaglutide. Additionally, I am having him maintain his losartan, metoprolol succinate, albuterol, furosemide, metFORMIN, and insulin glargine.   Meds ordered this encounter  Medications  . Dulaglutide (TRULICITY) 1.5 QM/2.1IZ SOPN    Sig: Inject 1.5 mg into the skin once a week.    Dispense:  4 pen    Refill:  2    Order Specific Question:   Supervising Provider    Answer:   Pricilla Holm A [[id number]]     Follow-up: Return if symptoms worsen or fail to improve.  Mauricio Po, FNP

## 2016-09-20 NOTE — Assessment & Plan Note (Addendum)
Blood sugars appear with improved control but do remain elevated above goal. Obtain A1c to check for improvement. Pneumovax updated today. Continue current dosage of metformin and Lantus. Increase Trulicity. Continue to monitor blood sugars at home and follow up in 2 months or sooner if needed.

## 2017-01-03 ENCOUNTER — Encounter: Payer: Self-pay | Admitting: Family

## 2017-01-03 ENCOUNTER — Ambulatory Visit (INDEPENDENT_AMBULATORY_CARE_PROVIDER_SITE_OTHER): Payer: BLUE CROSS/BLUE SHIELD | Admitting: Family

## 2017-01-03 ENCOUNTER — Other Ambulatory Visit (INDEPENDENT_AMBULATORY_CARE_PROVIDER_SITE_OTHER): Payer: BLUE CROSS/BLUE SHIELD

## 2017-01-03 VITALS — BP 172/96 | HR 51 | Temp 97.9°F | Resp 16 | Ht 68.0 in | Wt 204.0 lb

## 2017-01-03 DIAGNOSIS — I10 Essential (primary) hypertension: Secondary | ICD-10-CM

## 2017-01-03 DIAGNOSIS — E1165 Type 2 diabetes mellitus with hyperglycemia: Secondary | ICD-10-CM

## 2017-01-03 LAB — COMPREHENSIVE METABOLIC PANEL
ALBUMIN: 4 g/dL (ref 3.5–5.2)
ALT: 45 U/L (ref 0–53)
AST: 39 U/L — ABNORMAL HIGH (ref 0–37)
Alkaline Phosphatase: 172 U/L — ABNORMAL HIGH (ref 39–117)
BUN: 24 mg/dL — AB (ref 6–23)
CHLORIDE: 103 meq/L (ref 96–112)
CO2: 27 mEq/L (ref 19–32)
CREATININE: 1.6 mg/dL — AB (ref 0.40–1.50)
Calcium: 9.4 mg/dL (ref 8.4–10.5)
GFR: 59.81 mL/min — ABNORMAL LOW (ref >60.00)
GLUCOSE: 122 mg/dL — AB (ref 70–99)
POTASSIUM: 3.9 meq/L (ref 3.5–5.1)
SODIUM: 137 meq/L (ref 135–145)
Total Bilirubin: 0.8 mg/dL (ref 0.2–1.2)
Total Protein: 8.2 g/dL (ref 6.0–8.3)

## 2017-01-03 LAB — HEMOGLOBIN A1C: HEMOGLOBIN A1C: 7.4 % — AB (ref 4.6–6.5)

## 2017-01-03 MED ORDER — METFORMIN HCL ER 500 MG PO TB24: 500.0000 mg | ORAL_TABLET | Freq: Every day | ORAL | 1 refills | Status: DC

## 2017-01-03 MED ORDER — AMLODIPINE BESYLATE 10 MG PO TABS: 10.0000 mg | ORAL_TABLET | Freq: Every day | ORAL | 1 refills | Status: DC

## 2017-01-03 NOTE — Progress Notes (Signed)
Pre visit review using our clinic review tool, if applicable. No additional management support is needed unless otherwise documented below in the visit note. 

## 2017-01-03 NOTE — Patient Instructions (Addendum)
Thank you for choosing [facility].  SUMMARY AND INSTRUCTIONS:  Please continue to take your medications as prescribed.  Please start amlodipine daily.  Please monitor your blood pressures and blood sugars at home.  Continue to work on a low-sodium and low carbohydrate diet.  Follow-up pending blood work up results.   Medication:  Your prescription(s) have been submitted to your pharmacy or been printed and provided for you. Please take as directed and contact our office if you believe you are having problem(s) with the medication(s) or have any questions.  Labs:  Please stop by the lab on the lower level of the building for your blood work. Your results will be released to [place] (or called to you) after review, usually within 72 hours after test completion. If any changes need to be made, you will be notified at that same time.  1.) The lab is open from 7:30am to 5:30 pm Monday-Friday 2.) No appointment is necessary 3.) Fasting (if needed) is 6-8 hours after food and drink; black coffee and water are okay   Follow up:  If your symptoms worsen or fail to improve, please contact our office for further instruction, or in case of emergency go directly to the emergency room at the closest medical facility.

## 2017-01-03 NOTE — Assessment & Plan Note (Signed)
Type 2 diabetes appears with improved control based on home blood sugar readings less than 120. Obtain hemoglobin A1c. Continue current dosage of Lantus, metformin, and Trulicity. Continue to monitor blood sugars at home and follow a low/carbohydrate modify diet. Follow-up pending A1c results.

## 2017-01-03 NOTE — Assessment & Plan Note (Signed)
Blood pressure with worsening control and above goal 140/90 with current medication regimen. Continue current dosage of labetalol and losartan. Start amlodipine. Denies worst headache of life with no new symptoms of end organ damage noted on physical exam. Obtain complete metabolic profile to recheck kidney function. Follow-up in 2 weeks for blood pressure numbers.

## 2017-01-03 NOTE — Progress Notes (Signed)
Subjective:    Patient ID: Bryan Webb, male    DOB: 1971-03-21, 46 y.o.   MRN: [id number]  Chief Complaint  Patient presents with  . Medication Refill    needs refill of diabetic meds, needs to know what to take, A1c check    HPI:  Bryan Webb is a 46 y.o. male who  has a past medical history of Chicken pox; Diabetes mellitus without complication ([place]); Hyperlipidemia; and Hypertension. and presents today for a follow up office visit.  1.) Diabetes - Currently maintained on metformin, Lantus, and Trulicity. Reports taking the medication as prescribed and denies adverse side effects or hypoglycemic readings. Blood sugars at home have been below 120 . Denies new symptoms of end organ damage. No excessive hunger, thirst, or urination. Working on a low/carbohydrate modified oral intake.  Lab Results  Component Value Date   HGBA1C 12.5 (H) 09/20/2016     Lab Results  Component Value Date   CREATININE 1.51 (H) 08/12/2016   BUN 22 (H) 08/12/2016   NA 130 (L) 08/12/2016   K 4.5 08/12/2016   CL 95 (L) 08/12/2016   CO2 27 08/12/2016    2.) Hypertension -  Currently maintained on metoprolol, furosemide and losartan. and reports taking the medications as prescribed and denies adverse side effects or hypotensive readings. Blood pressures at home around 170s.. Denies changes in vision, worst headache of life or new symptoms of end organ damage. Working on following a low sodium diet. Physical activity level   BP Readings from Last 3 Encounters:  01/03/17 (!) 180/98  09/20/16 (!) 152/90  08/21/16 (!) 154/94     No Known Allergies    Outpatient Medications Prior to Visit  Medication Sig Dispense Refill  . albuterol (PROVENTIL HFA;VENTOLIN HFA) 108 (90 Base) MCG/ACT inhaler Inhale 2 puffs into the lungs every 6 (six) hours as needed for wheezing or shortness of breath.    . Dulaglutide (TRULICITY) 1.5 UV/2.5DG SOPN Inject 1.5 mg into the skin once a week. 4 pen 2  .  furosemide (LASIX) 40 MG tablet Take 40 mg by mouth daily as needed for fluid.    Marland Kitchen insulin glargine (LANTUS) 100 UNIT/ML injection Inject 0.1 mLs (10 Units total) into the skin at bedtime. Increase by 2 units the next night if the sugar is greater than 200 in the morning. Decrease by 2 units if it is less than 100 in the morning. (Patient taking differently: Inject 18 Units into the skin at bedtime. Increase by 2 units the next night if the sugar is greater than 200 in the morning. Decrease by 2 units if it is less than 100 in the morning.) 10 mL 0  . losartan (COZAAR) 100 MG tablet Take 100 mg by mouth daily.  0  . metoprolol succinate (TOPROL-XL) 100 MG 24 hr tablet Take 100 mg by mouth 2 (two) times daily.  0  . metFORMIN (GLUMETZA) 500 MG (MOD) 24 hr tablet Take 500 mg by mouth at bedtime.     No facility-administered medications prior to visit.       No past surgical history on file.    Past Medical History:  Diagnosis Date  . Chicken pox   . Diabetes mellitus without complication ([place])   . Hyperlipidemia   . Hypertension       Review of Systems  Constitutional: Negative for chills and fever.  Eyes:       Negative for changes in vision.  Respiratory: Negative for  cough, chest tightness, shortness of breath and wheezing.   Cardiovascular: Negative for chest pain, palpitations and leg swelling.  Endocrine: Negative for polydipsia, polyphagia and polyuria.  Neurological: Negative for dizziness, weakness, light-headedness and headaches.      Objective:    BP (!) 180/98 (BP Location: Left Arm, Patient Position: Sitting, Cuff Size: Large)   Pulse (!) 51   Temp 97.9 F (36.6 C) (Oral)   Resp 16   Ht '5\' 8"'  (1.727 m)   Wt 204 lb (92.5 kg)   SpO2 97%   BMI 31.02 kg/m  Nursing note and vital signs reviewed.  Physical Exam  Constitutional: He is oriented to person, place, and time. He appears well-developed and well-nourished. No distress.  Cardiovascular: Normal rate,  regular rhythm, normal heart sounds and intact distal pulses.   Pulmonary/Chest: Effort normal and breath sounds normal.  Neurological: He is alert and oriented to person, place, and time.  Skin: Skin is warm and dry.  Psychiatric: He has a normal mood and affect. His behavior is normal. Judgment and thought content normal.       Assessment & Plan:   Problem List Items Addressed This Visit      Cardiovascular and Mediastinum   Essential hypertension - Primary    Blood pressure with worsening control and above goal 140/90 with current medication regimen. Continue current dosage of labetalol and losartan. Start amlodipine. Denies worst headache of life with no new symptoms of end organ damage noted on physical exam. Obtain complete metabolic profile to recheck kidney function. Follow-up in 2 weeks for blood pressure numbers.      Relevant Medications   amLODipine (NORVASC) 10 MG tablet   Other Relevant Orders   Comp Met (CMET)   Hemoglobin A1c     Endocrine   Type 2 diabetes mellitus (HCC)    Type 2 diabetes appears with improved control based on home blood sugar readings less than 120. Obtain hemoglobin A1c. Continue current dosage of Lantus, metformin, and Trulicity. Continue to monitor blood sugars at home and follow a low/carbohydrate modify diet. Follow-up pending A1c results.      Relevant Medications   metFORMIN (GLUCOPHAGE XR) 500 MG 24 hr tablet   Other Relevant Orders   Comp Met (CMET)   Hemoglobin A1c       I have discontinued [name] metFORMIN. I am also having him start on metFORMIN and amLODipine. Additionally, I am having him maintain his losartan, metoprolol succinate, albuterol, furosemide, insulin glargine, and Dulaglutide.   Meds ordered this encounter  Medications  . metFORMIN (GLUCOPHAGE XR) 500 MG 24 hr tablet    Sig: Take 1 tablet (500 mg total) by mouth daily with breakfast.    Dispense:  90 tablet    Refill:  1    Order Specific Question:    Supervising Provider    Answer:   Pricilla Holm A [[id number]]  . amLODipine (NORVASC) 10 MG tablet    Sig: Take 1 tablet (10 mg total) by mouth daily.    Dispense:  30 tablet    Refill:  1    Order Specific Question:   Supervising Provider    Answer:   Pricilla Holm A [[id number]]     Follow-up: Return in about 2 weeks (around 01/17/2017), or if symptoms worsen or fail to improve.  Mauricio Po, FNP

## 2017-01-09 ENCOUNTER — Telehealth: Payer: Self-pay | Admitting: Family

## 2017-04-17 ENCOUNTER — Encounter: Payer: Self-pay | Admitting: Family

## 2017-04-17 ENCOUNTER — Other Ambulatory Visit (INDEPENDENT_AMBULATORY_CARE_PROVIDER_SITE_OTHER): Payer: BLUE CROSS/BLUE SHIELD

## 2017-04-17 ENCOUNTER — Ambulatory Visit (INDEPENDENT_AMBULATORY_CARE_PROVIDER_SITE_OTHER): Payer: BLUE CROSS/BLUE SHIELD | Admitting: Family

## 2017-04-17 VITALS — BP 140/84 | HR 56 | Temp 98.4°F | Resp 16 | Ht 68.0 in | Wt 197.0 lb

## 2017-04-17 DIAGNOSIS — Z794 Long term (current) use of insulin: Secondary | ICD-10-CM

## 2017-04-17 DIAGNOSIS — Z711 Person with feared health complaint in whom no diagnosis is made: Secondary | ICD-10-CM

## 2017-04-17 DIAGNOSIS — E119 Type 2 diabetes mellitus without complications: Secondary | ICD-10-CM

## 2017-04-17 DIAGNOSIS — I10 Essential (primary) hypertension: Secondary | ICD-10-CM | POA: Diagnosis not present

## 2017-04-17 LAB — MICROALBUMIN / CREATININE URINE RATIO
Creatinine,U: 62 mg/dL
MICROALB UR: 1.3 mg/dL (ref 0.0–1.9)
MICROALB/CREAT RATIO: 2.1 mg/g (ref 0.0–30.0)

## 2017-04-17 LAB — HEMOGLOBIN A1C: HEMOGLOBIN A1C: 9.1 % — AB (ref 4.6–6.5)

## 2017-04-17 MED ORDER — INSULIN GLARGINE 100 UNIT/ML ~~LOC~~ SOLN: 20.0000 [IU] | Freq: Every day | SUBCUTANEOUS | 0 refills | Status: DC

## 2017-04-17 MED ORDER — AMLODIPINE BESYLATE 10 MG PO TABS: 10.0000 mg | ORAL_TABLET | Freq: Every day | ORAL | 1 refills | Status: DC

## 2017-04-17 NOTE — Assessment & Plan Note (Signed)
Obtain hemoglobin A1c. No adverse side effects current medication regimen. Diabetic foot exam is up-to-date. Encouraged complete diabetic eye exam independently. Continue current dosage of metformin and Lantus. Encouraged to monitor blood sugars 1-2 times daily. Not currently maintained on a statin or Ace/ARB for CAD risk reduction. Follow-up pending A1c results.

## 2017-04-17 NOTE — Progress Notes (Signed)
Subjective:    Patient ID: Bryan Webb, male    DOB: 05-Dec-1970, 46 y.o.   MRN: [id number]  Chief Complaint  Patient presents with  . Follow-up    wants to do an STD check, A1c check    HPI:  Bryan Webb is a 46 y.o. male who  has a past medical history of Chicken pox; Diabetes mellitus without complication ([place]); Hyperlipidemia; and Hypertension. and presents today for a follow up office visit.   1.) Diabetes - Currently maintained on metformin and Lantus. Reports taking the medication as prescribed and denies adverse side effects or hypoglycemic readings. Blood sugars at home have been less than 120's . Denies new symptoms of end organ damage. No excessive hunger, thirst, or urination. Working on a low/carbohydrate modified oral intake.   Lab Results  Component Value Date   HGBA1C 9.1 (H) 04/17/2017     Lab Results  Component Value Date   CREATININE 1.60 (H) 01/03/2017   BUN 24 (H) 01/03/2017   NA 137 01/03/2017   K 3.9 01/03/2017   CL 103 01/03/2017   CO2 27 01/03/2017    2.) Concern for STD - Has a new partner and having unprotected incourse. Denies any current symptoms. Partner with no current symptoms.   No Known Allergies    Outpatient Medications Prior to Visit  Medication Sig Dispense Refill  . albuterol (PROVENTIL HFA;VENTOLIN HFA) 108 (90 Base) MCG/ACT inhaler Inhale 2 puffs into the lungs every 6 (six) hours as needed for wheezing or shortness of breath.    . furosemide (LASIX) 40 MG tablet Take 40 mg by mouth daily as needed for fluid.    Marland Kitchen losartan (COZAAR) 100 MG tablet Take 100 mg by mouth daily.  0  . metFORMIN (GLUCOPHAGE XR) 500 MG 24 hr tablet Take 1 tablet (500 mg total) by mouth daily with breakfast. 90 tablet 1  . metoprolol succinate (TOPROL-XL) 100 MG 24 hr tablet Take 100 mg by mouth 2 (two) times daily.  0  . amLODipine (NORVASC) 10 MG tablet Take 1 tablet (10 mg total) by mouth daily. 30 tablet 1  . Dulaglutide (TRULICITY) 1.5  JM/4.2AS SOPN Inject 1.5 mg into the skin once a week. 4 pen 2  . insulin glargine (LANTUS) 100 UNIT/ML injection Inject 0.1 mLs (10 Units total) into the skin at bedtime. Increase by 2 units the next night if the sugar is greater than 200 in the morning. Decrease by 2 units if it is less than 100 in the morning. (Patient taking differently: Inject 18 Units into the skin at bedtime. Increase by 2 units the next night if the sugar is greater than 200 in the morning. Decrease by 2 units if it is less than 100 in the morning.) 10 mL 0   No facility-administered medications prior to visit.       No past surgical history on file.    Past Medical History:  Diagnosis Date  . Chicken pox   . Diabetes mellitus without complication ([place])   . Hyperlipidemia   . Hypertension       Review of Systems  Constitutional: Negative for chills and fever.  Eyes:       Negative for changes in vision.  Respiratory: Negative for cough, chest tightness, shortness of breath and wheezing.   Cardiovascular: Negative for chest pain, palpitations and leg swelling.  Endocrine: Negative for polydipsia, polyphagia and polyuria.  Genitourinary: Negative for discharge, dysuria, flank pain, frequency, genital sores, hematuria,  penile pain, penile swelling, scrotal swelling, testicular pain and urgency.  Neurological: Negative for dizziness, weakness, light-headedness and headaches.      Objective:    BP 140/84 (BP Location: Left Arm, Patient Position: Sitting, Cuff Size: Large)   Pulse (!) 56   Temp 98.4 F (36.9 C) (Oral)   Resp 16   Ht 5\' 8"  (1.727 m)   Wt 197 lb (89.4 kg)   SpO2 96%   BMI 29.95 kg/m  Nursing note and vital signs reviewed.  Physical Exam  Constitutional: He is oriented to person, place, and time. He appears well-developed and well-nourished. No distress.  Cardiovascular: Normal rate, regular rhythm, normal heart sounds and intact distal pulses.   Pulmonary/Chest: Effort normal and  breath sounds normal.  Neurological: He is alert and oriented to person, place, and time.  Skin: Skin is warm and dry.  Psychiatric: He has a normal mood and affect. His behavior is normal. Judgment and thought content normal.       Assessment & Plan:   Problem List Items Addressed This Visit      Cardiovascular and Mediastinum   Essential hypertension    Blood pressure appears stable with current medication regimen and no adverse side effects or hypotensive readings. Continue current dosage of metoprolol, losartan, and amlodipine. Monitor blood pressure, follow low-sodium diet.      Relevant Medications   amLODipine (NORVASC) 10 MG tablet     Endocrine   Type 2 diabetes mellitus (HCC) - Primary    Obtain hemoglobin A1c. No adverse side effects current medication regimen. Diabetic foot exam is up-to-date. Encouraged complete diabetic eye exam independently. Continue current dosage of metformin and Lantus. Encouraged to monitor blood sugars 1-2 times daily. Not currently maintained on a statin or Ace/ARB for CAD risk reduction. Follow-up pending A1c results.      Relevant Medications   insulin glargine (LANTUS) 100 UNIT/ML injection   Other Relevant Orders   Hemoglobin A1c (Completed)   Urine Microalbumin w/creat. ratio (Completed)     Other   Concern about STD in male without diagnosis    Patient with concern for STDs without diagnosis in a new relationship with no current symptoms. Emphasize importance of protection to reduce risk for sexual transmitted diseases. Obtain gonorrhea/chlamydia. Patient declines syphilis and HIV testing at this time. Follow up and additional treatment pending lab work results.       Relevant Orders   GC/chlamydia probe amp, urine       I have discontinued Mr. Meriwether Dulaglutide. I have also changed his insulin glargine. Additionally, I am having him maintain his losartan, metoprolol succinate, albuterol, furosemide, metFORMIN, and  amLODipine.   Meds ordered this encounter  Medications  . insulin glargine (LANTUS) 100 UNIT/ML injection    Sig: Inject 0.2 mLs (20 Units total) into the skin at bedtime. Increase by 2 units the next night if the sugar is greater than 200 in the morning. Decrease by 2 units if it is less than 100 in the morning.    Dispense:  10 mL    Refill:  0    Order Specific Question:   Supervising Provider    Answer:   Pricilla Holm A [[id number]]  . amLODipine (NORVASC) 10 MG tablet    Sig: Take 1 tablet (10 mg total) by mouth daily.    Dispense:  90 tablet    Refill:  1    Order Specific Question:   Supervising Provider    Answer:   CRAWFORD,  ELIZABETH A [[id number]]     Follow-up: Return in about 3 months (around 07/18/2017), or if symptoms worsen or fail to improve.  Mauricio Po, FNP

## 2017-04-17 NOTE — Patient Instructions (Addendum)
Thank you for choosing [facility].  SUMMARY AND INSTRUCTIONS:  Continue to take your metformin and Lantus.   We will check A1c today.  Medication:  Your prescription(s) have been submitted to your pharmacy or been printed and provided for you. Please take as directed and contact our office if you believe you are having problem(s) with the medication(s) or have any questions.  Follow up:  If your symptoms worsen or fail to improve, please contact our office for further instruction, or in case of emergency go directly to the emergency room at the closest medical facility.   DIABETES CARE INSTRUCTIONS:  Current A1c:  Lab Results  Component Value Date   HGBA1C 7.4 (H) 01/03/2017    A1C Goal: <7.0%  Fasting Blood Sugar Goal: 80-130 Blood sugar check that you take after fasting for at least 8 hours which is usually in the morning before eating.  Post-prandial Blood Sugar Goal: <180 Blood sugar check approximately 1-2 hours after the start of a meal.   Diabetes Prevention Screens:  1.) Ensure you have an annual eye exam by an ophthalmologist or optometrist.   2,) Ensure you have an annual foot exam or when any changes are noted including new onset numbness/tingling or wounds. Check your feet daily!  3.) The American Diabetes Association recommends the Pneumovax vaccination against pneumonia every 5 years.  4.) We will check your kidney function with a urine test on an annual basis.

## 2017-04-17 NOTE — Assessment & Plan Note (Addendum)
Patient with concern for STDs without diagnosis in a new relationship with no current symptoms. Emphasize importance of protection to reduce risk for sexual transmitted diseases. Obtain gonorrhea/chlamydia. Patient declines syphilis and HIV testing at this time. Follow up and additional treatment pending lab work results.

## 2017-04-17 NOTE — Assessment & Plan Note (Signed)
Blood pressure appears stable with current medication regimen and no adverse side effects or hypotensive readings. Continue current dosage of metoprolol, losartan, and amlodipine. Monitor blood pressure, follow low-sodium diet.

## 2017-04-18 ENCOUNTER — Other Ambulatory Visit: Payer: Self-pay | Admitting: Family

## 2017-04-18 LAB — GC/CHLAMYDIA PROBE AMP
CT PROBE, AMP APTIMA: DETECTED — AB
GC PROBE AMP APTIMA: NOT DETECTED

## 2017-04-18 MED ORDER — AZITHROMYCIN 250 MG PO TABS: ORAL_TABLET | ORAL | 0 refills | Status: DC

## 2017-06-14 DIAGNOSIS — B3749 Other urogenital candidiasis: Secondary | ICD-10-CM | POA: Diagnosis not present

## 2017-06-14 DIAGNOSIS — R300 Dysuria: Secondary | ICD-10-CM | POA: Diagnosis not present

## 2017-06-14 DIAGNOSIS — R8290 Unspecified abnormal findings in urine: Secondary | ICD-10-CM | POA: Diagnosis not present

## 2017-06-15 DIAGNOSIS — R300 Dysuria: Secondary | ICD-10-CM | POA: Diagnosis not present

## 2017-07-24 DIAGNOSIS — E119 Type 2 diabetes mellitus without complications: Secondary | ICD-10-CM | POA: Diagnosis not present

## 2017-07-24 LAB — HM DIABETES EYE EXAM

## 2017-08-04 ENCOUNTER — Encounter: Payer: Self-pay | Admitting: Family

## 2017-08-19 NOTE — Telephone Encounter (Signed)
error 

## 2018-02-22 ENCOUNTER — Emergency Department (HOSPITAL_COMMUNITY): Payer: BLUE CROSS/BLUE SHIELD

## 2018-02-22 ENCOUNTER — Encounter (HOSPITAL_COMMUNITY): Payer: Self-pay | Admitting: Emergency Medicine

## 2018-02-22 ENCOUNTER — Emergency Department (HOSPITAL_COMMUNITY)
Admission: EM | Admit: 2018-02-22 | Discharge: 2018-02-22 | Disposition: A | Discharge status: Home / Self Care | Payer: BLUE CROSS/BLUE SHIELD | Attending: Emergency Medicine | Admitting: Emergency Medicine

## 2018-02-22 DIAGNOSIS — M545 Low back pain, unspecified: Secondary | ICD-10-CM

## 2018-02-22 DIAGNOSIS — Z79899 Other long term (current) drug therapy: Secondary | ICD-10-CM | POA: Diagnosis not present

## 2018-02-22 DIAGNOSIS — I10 Essential (primary) hypertension: Secondary | ICD-10-CM | POA: Diagnosis not present

## 2018-02-22 DIAGNOSIS — E119 Type 2 diabetes mellitus without complications: Secondary | ICD-10-CM | POA: Insufficient documentation

## 2018-02-22 DIAGNOSIS — R51 Headache: Secondary | ICD-10-CM | POA: Diagnosis not present

## 2018-02-22 DIAGNOSIS — Z87891 Personal history of nicotine dependence: Secondary | ICD-10-CM | POA: Insufficient documentation

## 2018-02-22 DIAGNOSIS — Z7984 Long term (current) use of oral hypoglycemic drugs: Secondary | ICD-10-CM | POA: Diagnosis not present

## 2018-02-22 DIAGNOSIS — R519 Headache, unspecified: Secondary | ICD-10-CM

## 2018-02-22 MED ORDER — METHOCARBAMOL 500 MG PO TABS: 500.0000 mg | ORAL_TABLET | Freq: Two times a day (BID) | ORAL | 0 refills | Status: DC

## 2018-02-22 MED ORDER — IBUPROFEN 200 MG PO TABS
600.0000 mg | ORAL_TABLET | Freq: Once | ORAL | Status: AC
  Administered 2018-02-22: 600 mg via ORAL
  Filled 2018-02-22: qty 3

## 2018-02-22 MED ORDER — IBUPROFEN 600 MG PO TABS: 600.0000 mg | ORAL_TABLET | Freq: Four times a day (QID) | ORAL | 0 refills | Status: DC | PRN

## 2018-02-22 NOTE — ED Notes (Signed)
Patient transported to X-ray 

## 2018-02-22 NOTE — Discharge Instructions (Signed)
The pain your experiencing is likely due to muscle strain, you may take Ibuprofen and Robaxin as needed for pain management. Do not combine with any pain reliever other than tylenol. The muscle soreness should improve over the next week. Follow up with your family doctor in the next week for a recheck if you are still having symptoms. Return to ED if pain is worsening, you develop weakness or numbness of extremities, or new or concerning symptoms develop.  Your blood pressure was elevated today, please ensure you are taking her blood pressure medications regularly, please follow up with your primary doctor in 1 week for blood pressure recheck.
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
## 2018-05-08 DIAGNOSIS — M542 Cervicalgia: Secondary | ICD-10-CM | POA: Diagnosis not present

## 2018-05-11 DIAGNOSIS — M50322 Other cervical disc degeneration at C5-C6 level: Secondary | ICD-10-CM | POA: Diagnosis not present

## 2018-05-22 DIAGNOSIS — R531 Weakness: Secondary | ICD-10-CM | POA: Diagnosis not present

## 2018-05-22 DIAGNOSIS — M62838 Other muscle spasm: Secondary | ICD-10-CM | POA: Diagnosis not present

## 2018-05-22 DIAGNOSIS — M542 Cervicalgia: Secondary | ICD-10-CM | POA: Diagnosis not present

## 2018-05-22 DIAGNOSIS — R293 Abnormal posture: Secondary | ICD-10-CM | POA: Diagnosis not present

## 2018-05-22 DIAGNOSIS — M4302 Spondylolysis, cervical region: Secondary | ICD-10-CM | POA: Diagnosis not present

## 2018-05-25 DIAGNOSIS — R531 Weakness: Secondary | ICD-10-CM | POA: Diagnosis not present

## 2018-05-25 DIAGNOSIS — M542 Cervicalgia: Secondary | ICD-10-CM | POA: Diagnosis not present

## 2018-05-25 DIAGNOSIS — M62838 Other muscle spasm: Secondary | ICD-10-CM | POA: Diagnosis not present

## 2018-05-25 DIAGNOSIS — R293 Abnormal posture: Secondary | ICD-10-CM | POA: Diagnosis not present

## 2018-05-27 DIAGNOSIS — M542 Cervicalgia: Secondary | ICD-10-CM | POA: Diagnosis not present

## 2018-05-27 DIAGNOSIS — M62838 Other muscle spasm: Secondary | ICD-10-CM | POA: Diagnosis not present

## 2018-05-27 DIAGNOSIS — R293 Abnormal posture: Secondary | ICD-10-CM | POA: Diagnosis not present

## 2018-05-27 DIAGNOSIS — R531 Weakness: Secondary | ICD-10-CM | POA: Diagnosis not present

## 2018-06-01 DIAGNOSIS — M62838 Other muscle spasm: Secondary | ICD-10-CM | POA: Diagnosis not present

## 2018-06-01 DIAGNOSIS — R293 Abnormal posture: Secondary | ICD-10-CM | POA: Diagnosis not present

## 2018-06-01 DIAGNOSIS — R531 Weakness: Secondary | ICD-10-CM | POA: Diagnosis not present

## 2018-06-01 DIAGNOSIS — M542 Cervicalgia: Secondary | ICD-10-CM | POA: Diagnosis not present

## 2018-06-03 DIAGNOSIS — R293 Abnormal posture: Secondary | ICD-10-CM | POA: Diagnosis not present

## 2018-06-03 DIAGNOSIS — R531 Weakness: Secondary | ICD-10-CM | POA: Diagnosis not present

## 2018-06-03 DIAGNOSIS — M542 Cervicalgia: Secondary | ICD-10-CM | POA: Diagnosis not present

## 2018-06-03 DIAGNOSIS — M62838 Other muscle spasm: Secondary | ICD-10-CM | POA: Diagnosis not present

## 2018-06-08 DIAGNOSIS — R293 Abnormal posture: Secondary | ICD-10-CM | POA: Diagnosis not present

## 2018-06-08 DIAGNOSIS — R531 Weakness: Secondary | ICD-10-CM | POA: Diagnosis not present

## 2018-06-08 DIAGNOSIS — M62838 Other muscle spasm: Secondary | ICD-10-CM | POA: Diagnosis not present

## 2018-06-08 DIAGNOSIS — M542 Cervicalgia: Secondary | ICD-10-CM | POA: Diagnosis not present

## 2018-06-10 DIAGNOSIS — M62838 Other muscle spasm: Secondary | ICD-10-CM | POA: Diagnosis not present

## 2018-06-10 DIAGNOSIS — R531 Weakness: Secondary | ICD-10-CM | POA: Diagnosis not present

## 2018-06-10 DIAGNOSIS — R293 Abnormal posture: Secondary | ICD-10-CM | POA: Diagnosis not present

## 2018-06-10 DIAGNOSIS — M542 Cervicalgia: Secondary | ICD-10-CM | POA: Diagnosis not present

## 2018-06-15 DIAGNOSIS — M62838 Other muscle spasm: Secondary | ICD-10-CM | POA: Diagnosis not present

## 2018-06-15 DIAGNOSIS — M542 Cervicalgia: Secondary | ICD-10-CM | POA: Diagnosis not present

## 2018-06-15 DIAGNOSIS — R531 Weakness: Secondary | ICD-10-CM | POA: Diagnosis not present

## 2018-06-15 DIAGNOSIS — R293 Abnormal posture: Secondary | ICD-10-CM | POA: Diagnosis not present

## 2018-06-17 DIAGNOSIS — M62838 Other muscle spasm: Secondary | ICD-10-CM | POA: Diagnosis not present

## 2018-06-17 DIAGNOSIS — R293 Abnormal posture: Secondary | ICD-10-CM | POA: Diagnosis not present

## 2018-06-17 DIAGNOSIS — M542 Cervicalgia: Secondary | ICD-10-CM | POA: Diagnosis not present

## 2018-06-17 DIAGNOSIS — R531 Weakness: Secondary | ICD-10-CM | POA: Diagnosis not present

## 2018-06-19 DIAGNOSIS — M542 Cervicalgia: Secondary | ICD-10-CM | POA: Diagnosis not present

## 2018-06-19 DIAGNOSIS — M4302 Spondylolysis, cervical region: Secondary | ICD-10-CM | POA: Diagnosis not present

## 2018-06-22 DIAGNOSIS — R293 Abnormal posture: Secondary | ICD-10-CM | POA: Diagnosis not present

## 2018-06-22 DIAGNOSIS — R531 Weakness: Secondary | ICD-10-CM | POA: Diagnosis not present

## 2018-06-22 DIAGNOSIS — M542 Cervicalgia: Secondary | ICD-10-CM | POA: Diagnosis not present

## 2018-06-22 DIAGNOSIS — M62838 Other muscle spasm: Secondary | ICD-10-CM | POA: Diagnosis not present

## 2018-06-24 DIAGNOSIS — R293 Abnormal posture: Secondary | ICD-10-CM | POA: Diagnosis not present

## 2018-06-24 DIAGNOSIS — M542 Cervicalgia: Secondary | ICD-10-CM | POA: Diagnosis not present

## 2018-06-24 DIAGNOSIS — R531 Weakness: Secondary | ICD-10-CM | POA: Diagnosis not present

## 2018-06-24 DIAGNOSIS — M62838 Other muscle spasm: Secondary | ICD-10-CM | POA: Diagnosis not present

## 2018-06-29 DIAGNOSIS — M62838 Other muscle spasm: Secondary | ICD-10-CM | POA: Diagnosis not present

## 2018-06-29 DIAGNOSIS — M542 Cervicalgia: Secondary | ICD-10-CM | POA: Diagnosis not present

## 2018-06-29 DIAGNOSIS — R531 Weakness: Secondary | ICD-10-CM | POA: Diagnosis not present

## 2018-06-29 DIAGNOSIS — R293 Abnormal posture: Secondary | ICD-10-CM | POA: Diagnosis not present

## 2018-07-01 DIAGNOSIS — M542 Cervicalgia: Secondary | ICD-10-CM | POA: Diagnosis not present

## 2018-07-01 DIAGNOSIS — R293 Abnormal posture: Secondary | ICD-10-CM | POA: Diagnosis not present

## 2018-07-01 DIAGNOSIS — R531 Weakness: Secondary | ICD-10-CM | POA: Diagnosis not present

## 2018-07-01 DIAGNOSIS — M62838 Other muscle spasm: Secondary | ICD-10-CM | POA: Diagnosis not present

## 2018-07-06 DIAGNOSIS — M62838 Other muscle spasm: Secondary | ICD-10-CM | POA: Diagnosis not present

## 2018-07-06 DIAGNOSIS — M542 Cervicalgia: Secondary | ICD-10-CM | POA: Diagnosis not present

## 2018-07-06 DIAGNOSIS — R293 Abnormal posture: Secondary | ICD-10-CM | POA: Diagnosis not present

## 2018-07-06 DIAGNOSIS — R531 Weakness: Secondary | ICD-10-CM | POA: Diagnosis not present

## 2018-09-02 ENCOUNTER — Emergency Department (HOSPITAL_COMMUNITY)
Admission: EM | Admit: 2018-09-02 | Discharge: 2018-09-02 | Disposition: A | Discharge status: Home / Self Care | Payer: BLUE CROSS/BLUE SHIELD | Attending: Emergency Medicine | Admitting: Emergency Medicine

## 2018-09-02 ENCOUNTER — Encounter (HOSPITAL_COMMUNITY): Payer: Self-pay

## 2018-09-02 ENCOUNTER — Other Ambulatory Visit: Payer: Self-pay

## 2018-09-02 DIAGNOSIS — F17210 Nicotine dependence, cigarettes, uncomplicated: Secondary | ICD-10-CM | POA: Diagnosis not present

## 2018-09-02 DIAGNOSIS — I129 Hypertensive chronic kidney disease with stage 1 through stage 4 chronic kidney disease, or unspecified chronic kidney disease: Secondary | ICD-10-CM | POA: Insufficient documentation

## 2018-09-02 DIAGNOSIS — Z794 Long term (current) use of insulin: Secondary | ICD-10-CM | POA: Diagnosis not present

## 2018-09-02 DIAGNOSIS — I10 Essential (primary) hypertension: Secondary | ICD-10-CM

## 2018-09-02 DIAGNOSIS — Z79899 Other long term (current) drug therapy: Secondary | ICD-10-CM | POA: Insufficient documentation

## 2018-09-02 DIAGNOSIS — E1122 Type 2 diabetes mellitus with diabetic chronic kidney disease: Secondary | ICD-10-CM | POA: Diagnosis not present

## 2018-09-02 DIAGNOSIS — N189 Chronic kidney disease, unspecified: Secondary | ICD-10-CM | POA: Diagnosis not present

## 2018-09-02 LAB — CBC
HCT: 38.6 % — ABNORMAL LOW (ref 39.0–52.0)
HEMOGLOBIN: 12.7 g/dL — AB (ref 13.0–17.0)
MCH: 31.2 pg (ref 26.0–34.0)
MCHC: 32.9 g/dL (ref 30.0–36.0)
MCV: 94.8 fL (ref 80.0–100.0)
NRBC: 0 % (ref 0.0–0.2)
Platelets: 206 10*3/uL (ref 150–400)
RBC: 4.07 MIL/uL — ABNORMAL LOW (ref 4.22–5.81)
RDW: 12.9 % (ref 11.5–15.5)
WBC: 3.9 10*3/uL — AB (ref 4.0–10.5)

## 2018-09-02 LAB — BASIC METABOLIC PANEL
Anion gap: 9 (ref 5–15)
BUN: 38 mg/dL — ABNORMAL HIGH (ref 6–20)
CALCIUM: 9.6 mg/dL (ref 8.9–10.3)
CO2: 25 mmol/L (ref 22–32)
CREATININE: 1.98 mg/dL — AB (ref 0.61–1.24)
Chloride: 105 mmol/L (ref 98–111)
GFR calc non Af Amer: 39 mL/min — ABNORMAL LOW (ref >60)
GFR, EST AFRICAN AMERICAN: 45 mL/min — AB (ref >60)
Glucose, Bld: 145 mg/dL — ABNORMAL HIGH (ref 70–99)
Potassium: 4.3 mmol/L (ref 3.5–5.1)
SODIUM: 139 mmol/L (ref 135–145)

## 2018-09-02 MED ORDER — CLONIDINE HCL 0.1 MG PO TABS
0.1000 mg | ORAL_TABLET | Freq: Once | ORAL | Status: AC
  Administered 2018-09-02: 0.1 mg via ORAL
  Filled 2018-09-02: qty 1

## 2018-09-02 MED ORDER — HYDRALAZINE HCL 10 MG PO TABS
10.0000 mg | ORAL_TABLET | Freq: Once | ORAL | Status: AC
  Administered 2018-09-02: 10 mg via ORAL
  Filled 2018-09-02: qty 1

## 2018-09-02 MED ORDER — CLONIDINE HCL 0.1 MG PO TABS: 0.2000 mg | ORAL_TABLET | Freq: Once | ORAL | Status: DC

## 2018-09-02 MED ORDER — HYDRALAZINE HCL 10 MG PO TABS: 10.0000 mg | ORAL_TABLET | Freq: Four times a day (QID) | ORAL | 0 refills | Status: DC

## 2018-09-02 NOTE — ED Provider Notes (Signed)
[facility] DEPT Provider Note   CSN: [id number] Arrival date & time: 09/02/18  1254     History   Chief Complaint Chief Complaint  Patient presents with  . Hypertension    HPI Bryan Webb is a 47 y.o. male.  Patient with history of hypertension currently taking metoprolol, losartan, amlodipine, history of diabetes, history of high cholesterol --presents the emergency department with complaint of high blood pressure.  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  Also his heart rate was lower than it typically is, in the 40s to 50s.  He states that it is typically in the 30s.  Patient denies any other symptoms.  No headaches, vision loss, chest pain, shortness of breath, abdominal pain.  No history of vomiting or diarrhea.  He denies any lightheadedness with standing or syncopal spells.  States that sometimes his blood pressures go up into the 170 range even with treatment.  Denies any new medications or recent medication changes.  States that he has a significant family history of hypertension.  Onset of symptoms insidious.  Course is constant.     Past Medical History:  Diagnosis Date  . Chicken pox   . Diabetes mellitus without complication ([place])   . Hyperlipidemia   . Hypertension     Patient Active Problem List   Diagnosis Date Noted  . Concern about STD in male without diagnosis 04/17/2017  . Essential hypertension 01/03/2017  . Type 2 diabetes mellitus ([place]) 08/21/2016    History reviewed. No pertinent surgical history.      Home Medications    Prior to Admission medications   Medication Sig Start Date End Date Taking? Authorizing Provider  amLODipine (NORVASC) 10 MG tablet Take 1 tablet (10 mg total) by mouth daily. 04/17/17  Yes Golden Circle, FNP  furosemide (LASIX) 40 MG tablet Take 40 mg by mouth daily as needed for  fluid.   Yes [provider]  insulin glargine (LANTUS) 100 UNIT/ML injection Inject 0.2 mLs (20 Units total) into the skin at bedtime. Increase by 2 units the next night if the sugar is greater than 200 in the morning. Decrease by 2 units if it is less than 100 in the morning. 04/17/17  Yes Golden Circle, FNP  losartan (COZAAR) 100 MG tablet Take 100 mg by mouth daily. 06/14/16  Yes [provider]  metFORMIN (GLUCOPHAGE XR) 500 MG 24 hr tablet Take 1 tablet (500 mg total) by mouth daily with breakfast. 01/03/17  Yes Golden Circle, FNP  metoprolol succinate (TOPROL-XL) 100 MG 24 hr tablet Take 100 mg by mouth 2 (two) times daily. 07/10/16  Yes [provider]  sildenafil (VIAGRA) 100 MG tablet Take 50 mg by mouth daily as needed.   Yes [provider]  azithromycin (ZITHROMAX) 250 MG tablet Please take 4 tablets by mouth once. Patient not taking: Reported on 09/02/2018 04/18/17   Golden Circle, FNP  ibuprofen (ADVIL,MOTRIN) 600 MG tablet Take 1 tablet (600 mg total) by mouth every 6 (six) hours as needed. Patient not taking: Reported on 09/02/2018 02/22/18   Jacqlyn Larsen, PA-C  methocarbamol (ROBAXIN) 500 MG tablet Take 1 tablet (500 mg total) by mouth 2 (two) times daily. Patient not taking: Reported on 09/02/2018 02/22/18   Jacqlyn Larsen, PA-C    Family History Family History  Problem Relation Age of Onset  . Hypertension  Mother   . Hypertension Father   . Hypertension Maternal Grandmother   . Hypertension Maternal Grandfather   . Hypertension Paternal Grandmother   . Diabetes Paternal Grandmother   . Hypertension Paternal Grandfather     Social History Social History   Tobacco Use  . Smoking status: Current Every Day Smoker    Packs/day: 0.25    Years: 20.00    Pack years: 5.00  . Smokeless tobacco: Never Used  Substance Use Topics  . Alcohol use: No  . Drug use: No     Allergies   Patient has no known allergies.   Review of  Systems Review of Systems  Constitutional: Negative for fever.  HENT: Negative for rhinorrhea and sore throat.   Eyes: Negative for redness.  Respiratory: Negative for cough and shortness of breath.   Cardiovascular: Negative for chest pain.  Gastrointestinal: Negative for abdominal pain, diarrhea, nausea and vomiting.  Genitourinary: Negative for dysuria.  Musculoskeletal: Negative for myalgias.  Skin: Negative for rash.  Neurological: Negative for headaches.     Physical Exam Updated Vital Signs BP (!) 206/108 (BP Location: Right Arm)   Pulse (!) 52   Temp 97.7 F (36.5 C) (Oral)   Resp 16   Ht 5\' 6"  (1.676 m)   Wt 91.6 kg   SpO2 100%   BMI 32.60 kg/m   Physical Exam Vitals signs and nursing note reviewed.  Constitutional:      Appearance: He is well-developed.  HENT:     Head: Normocephalic and atraumatic.     Right Ear: Tympanic membrane, ear canal and external ear normal.     Left Ear: Tympanic membrane, ear canal and external ear normal.     Nose: Nose normal.     Mouth/Throat:     Pharynx: Uvula midline.  Eyes:     General: Lids are normal.        Right eye: No discharge.        Left eye: No discharge.     Conjunctiva/sclera: Conjunctivae normal.     Pupils: Pupils are equal, round, and reactive to light.  Neck:     Musculoskeletal: Normal range of motion and neck supple.  Cardiovascular:     Rate and Rhythm: Regular rhythm. Bradycardia present.     Heart sounds: Normal heart sounds.  Pulmonary:     Effort: Pulmonary effort is normal.     Breath sounds: Normal breath sounds.  Abdominal:     Palpations: Abdomen is soft.     Tenderness: There is no abdominal tenderness.  Musculoskeletal: Normal range of motion.     Cervical back: He exhibits normal range of motion, no tenderness and no bony tenderness.  Skin:    General: Skin is warm and dry.  Neurological:     Mental Status: He is alert and oriented to person, place, and time.     GCS: GCS eye  subscore is 4. GCS verbal subscore is 5. GCS motor subscore is 6.     Cranial Nerves: No cranial nerve deficit.     Sensory: No sensory deficit.     Motor: No abnormal muscle tone.     Coordination: Coordination normal.     Gait: Gait normal.     Deep Tendon Reflexes: Reflexes are normal and symmetric.      ED Treatments / Results  Labs (all labs ordered are listed, but only abnormal results are displayed) Labs Reviewed  CBC - Abnormal; Notable for the following components:  Result Value   WBC 3.9 (*)    RBC 4.07 (*)    Hemoglobin 12.7 (*)    HCT 38.6 (*)    All other components within normal limits  BASIC METABOLIC PANEL - Abnormal; Notable for the following components:   Glucose, Bld 145 (*)    BUN 38 (*)    Creatinine, Ser 1.98 (*)    GFR calc non Af Amer 39 (*)    GFR calc Af Amer 45 (*)    All other components within normal limits    EKG EKG Interpretation  Date/Time:  Wednesday September 02 2018 14:10:00 EST Ventricular Rate:  49 PR Interval:    QRS Duration: 76 QT Interval:  655 QTC Calculation: 592 R Axis:   -22 Text Interpretation:  Sinus bradycardia Borderline left axis deviation Nonspecific T abnormalities, diffuse leads Prolonged QT interval Baseline wander in lead(s) I III aVL aVF V3 V6 No previous ECGs available Confirmed by Gareth Morgan [phone]) on 09/02/2018 2:56:11 PM   Radiology No results found.  Procedures Procedures (including critical care time)  Medications Ordered in ED Medications  hydrALAZINE (APRESOLINE) tablet 10 mg (has no administration in time range)  cloNIDine (CATAPRES) tablet 0.1 mg (0.1 mg Oral Given 09/02/18 1412)     Initial Impression / Assessment and Plan / ED Course  I have reviewed the triage vital signs and the nursing notes.  Pertinent labs & imaging results that were available during my care of the patient were reviewed by me and considered in my medical decision making (see chart for details).      Patient seen and examined.  Will check labs, EKG.  Patient is maxed out on his blood pressure medications.  Will give small dose of clonidine.  Patient is asymptomatic from a bradycardia standpoint.  Vital signs reviewed and are as follows: BP (!) 206/108 (BP Location: Right Arm)   Pulse (!) 52   Temp 97.7 F (36.5 C) (Oral)   Resp 16   Ht 5\' 6"  (1.676 m)   Wt 91.6 kg   SpO2 100%   BMI 32.60 kg/m   3:39 PM patient without much improvement in blood pressure.  Labs show worsening chronic kidney disease.  Discussed with Dr. Billy Fischer.  Given worsening kidney function, will add an additional blood pressure medication, hydralazine.  I had a long discussion with patient and family at bedside.  We discussed his worsening kidney function.  Discussed need for very close follow-up with his primary care doctor to continue to work on his blood pressure.  I have sent his PCP a note requesting close follow-up and making them aware of findings today.  Discussed role of diet and exercise in helping blood pressure management.  Patient does state that he has lost approximately 30 pounds.  It sounds like he is under a lot of stress working 3 jobs.  Otherwise, we will discharge patient today.  Encouraged to continue to take his medications and follow-up as discussed.  Encouraged return with worsening symptoms or other concerns.  Final Clinical Impressions(s) / ED Diagnoses   Final diagnoses:  Essential hypertension  Chronic kidney disease, unspecified CKD stage   Patient sent to the emergency department today for elevated blood pressures, despite taking 3 home medications.  It sounds like it is been difficult for patient to have his blood pressure well controlled.  He has had chronic kidney disease, noted to be worsening today.  He will need to have his kidney function rechecked.  Hydralazine added  to his current regimen.  PCP notified.  He will need very close follow-up.  No indications for admission at  this time however I stressed the importance of continued aggressive blood pressure management to help preserve his kidney function.  ED Discharge Orders         Ordered    hydrALAZINE (APRESOLINE) 10 MG tablet  4 times daily     09/02/18 1520           Carlisle Cater, [place] 09/02/18 1542    Gareth Morgan, MD 09/03/18 1115

## 2018-09-02 NOTE — Discharge Instructions (Signed)
Please read and follow all provided instructions.  Your diagnoses today include:  1. Essential hypertension   2. Chronic kidney disease, unspecified CKD stage    Your blood pressure was high today (BP): BP (!) 184/103    Pulse 67    Temp 97.7 F (36.5 C) (Oral)    Resp 15    Ht 5\' 6"  (1.676 m)    Wt 91.6 kg    SpO2 99%    BMI 32.60 kg/m   Tests performed today include:  Vital signs. See below for your results today.   Blood counts and electrolytes - shows weakening of the kidneys  Medications prescribed:   Hydralazine - blood pressure medication  Home care instructions:  Follow any educational materials contained in this packet.  Follow-up instructions: Please follow-up with your primary care provider in the next 2 days for a recheck of your symptoms and blood pressure.    Return instructions:   Please return to the Emergency Department if you experience worsening symptoms.   Return with severe chest pain, abdominal pain, or shortness of breath.   Return with severe headache, focal weakness, numbness, difficulty with speech or vision.  Return with loss of vision or sudden vision change.  Please return if you have any other emergent concerns.  Additional Information:  Your vital signs today were: BP (!) 184/103    Pulse 67    Temp 97.7 F (36.5 C) (Oral)    Resp 15    Ht 5\' 6"  (1.676 m)    Wt 91.6 kg    SpO2 99%    BMI 32.60 kg/m  If your blood pressure (BP) was elevated above 135/85 this visit, please have this repeated by your doctor within one month. --------------

## 2018-09-02 NOTE — ED Triage Notes (Signed)
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 Pt denies headache, blurred. Pt states he feels his normal.

## 2018-09-03 ENCOUNTER — Telehealth: Payer: Self-pay | Admitting: *Deleted

## 2018-09-03 NOTE — Telephone Encounter (Signed)
Pt has made appt to establish w/[facility] 09/21/18.Marland KitchenJohny Webb

## 2018-09-03 NOTE — Telephone Encounter (Signed)
-----   Message from Golden Circle, [place] sent at 09/02/2018  3:38 PM EST ----- Regarding: Follow up. Thank you for the information. Mr. Reinders will need to contact [facility] for follow as I am no longer a provider there. I will forward this information to their office.  Regards,Greg ----- Message ----- From: Carlisle Cater, [place]ia Sent: 09/02/2018   3:25 PM EST To: Golden Circle, FNP  Mr. Elna Breslow,   Mr. 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  He states that he has been taking his 3 blood pressure medications.  Systolic blood pressures here have ranged from the 180s into the low 200s.  His creatinine seems to be worsening and today is close to 2.  I will be giving him a low-dose of hydralazine to supplement his other medications.  I wanted to let you know he was here and to see if you could help arrange for close follow-up for blood pressure recheck and continued management of his kidney function.  Thank you,  Alecia Lemming PA-C

## 2018-09-21 ENCOUNTER — Encounter: Payer: Self-pay | Admitting: Nurse Practitioner

## 2018-09-21 ENCOUNTER — Other Ambulatory Visit (INDEPENDENT_AMBULATORY_CARE_PROVIDER_SITE_OTHER): Payer: BLUE CROSS/BLUE SHIELD

## 2018-09-21 ENCOUNTER — Ambulatory Visit: Payer: BLUE CROSS/BLUE SHIELD | Admitting: Nurse Practitioner

## 2018-09-21 VITALS — BP 160/92 | HR 56 | Ht 66.0 in | Wt 198.0 lb

## 2018-09-21 DIAGNOSIS — Z794 Long term (current) use of insulin: Secondary | ICD-10-CM

## 2018-09-21 DIAGNOSIS — I10 Essential (primary) hypertension: Secondary | ICD-10-CM

## 2018-09-21 DIAGNOSIS — R609 Edema, unspecified: Secondary | ICD-10-CM

## 2018-09-21 DIAGNOSIS — E119 Type 2 diabetes mellitus without complications: Secondary | ICD-10-CM | POA: Diagnosis not present

## 2018-09-21 LAB — CBC
HCT: 39.7 % (ref 39.0–52.0)
HEMOGLOBIN: 13.6 g/dL (ref 13.0–17.0)
MCHC: 34.3 g/dL (ref 30.0–36.0)
MCV: 93 fl (ref 78.0–100.0)
PLATELETS: 227 10*3/uL (ref 150.0–400.0)
RBC: 4.27 Mil/uL (ref 4.22–5.81)
RDW: 13.4 % (ref 11.5–15.5)
WBC: 4.9 10*3/uL (ref 4.0–10.5)

## 2018-09-21 LAB — COMPREHENSIVE METABOLIC PANEL
ALBUMIN: 4.3 g/dL (ref 3.5–5.2)
ALT: 79 U/L — ABNORMAL HIGH (ref 0–53)
AST: 51 U/L — AB (ref 0–37)
Alkaline Phosphatase: 147 U/L — ABNORMAL HIGH (ref 39–117)
BILIRUBIN TOTAL: 0.8 mg/dL (ref 0.2–1.2)
BUN: 44 mg/dL — ABNORMAL HIGH (ref 6–23)
CALCIUM: 10 mg/dL (ref 8.4–10.5)
CHLORIDE: 101 meq/L (ref 96–112)
CO2: 23 mEq/L (ref 19–32)
CREATININE: 2.38 mg/dL — AB (ref 0.40–1.50)
GFR: 37.55 mL/min — AB (ref >60.00)
Glucose, Bld: 153 mg/dL — ABNORMAL HIGH (ref 70–99)
Potassium: 4.2 mEq/L (ref 3.5–5.1)
Sodium: 133 mEq/L — ABNORMAL LOW (ref 135–145)
Total Protein: 8.1 g/dL (ref 6.0–8.3)

## 2018-09-21 LAB — LIPID PANEL
CHOLESTEROL: 274 mg/dL — AB (ref 0–200)
HDL: 32.7 mg/dL — AB (ref >39.00)
NONHDL: 241.48
TRIGLYCERIDES: 244 mg/dL — AB (ref 0.0–149.0)
Total CHOL/HDL Ratio: 8
VLDL: 48.8 mg/dL — ABNORMAL HIGH (ref 0.0–40.0)

## 2018-09-21 LAB — LDL CHOLESTEROL, DIRECT: LDL DIRECT: 178 mg/dL

## 2018-09-21 LAB — HEMOGLOBIN A1C: Hgb A1c MFr Bld: 8.9 % — ABNORMAL HIGH (ref 4.6–6.5)

## 2018-09-21 MED ORDER — LOSARTAN POTASSIUM 100 MG PO TABS: 100.0000 mg | ORAL_TABLET | Freq: Every day | ORAL | 1 refills | Status: DC

## 2018-09-21 MED ORDER — AMLODIPINE BESYLATE 10 MG PO TABS: 10.0000 mg | ORAL_TABLET | Freq: Every day | ORAL | 1 refills | Status: DC

## 2018-09-21 MED ORDER — METOPROLOL SUCCINATE ER 100 MG PO TB24: 100.0000 mg | ORAL_TABLET | Freq: Two times a day (BID) | ORAL | 1 refills | Status: DC

## 2018-09-21 NOTE — Progress Notes (Signed)
Bryan Webb is a 48 y.o. male with the following history as recorded in Epic[facility]:  Patient Active Problem List   Diagnosis Date Noted  . Concern about STD in male without diagnosis 04/17/2017  . Essential hypertension 01/03/2017  . Type 2 diabetes mellitus ([place]) 08/21/2016    Current Outpatient Medications  Medication Sig Dispense Refill  . amLODipine (NORVASC) 10 MG tablet Take 1 tablet (10 mg total) by mouth daily. 30 tablet 1  . furosemide (LASIX) 40 MG tablet Take 40 mg by mouth daily as needed for fluid.    Marland Kitchen insulin glargine (LANTUS) 100 UNIT/ML injection Inject 0.2 mLs (20 Units total) into the skin at bedtime. Increase by 2 units the next night if the sugar is greater than 200 in the morning. Decrease by 2 units if it is less than 100 in the morning. 10 mL 0  . losartan (COZAAR) 100 MG tablet Take 1 tablet (100 mg total) by mouth daily. 30 tablet 1  . metFORMIN (GLUCOPHAGE XR) 500 MG 24 hr tablet Take 1 tablet (500 mg total) by mouth daily with breakfast. 90 tablet 1  . metoprolol succinate (TOPROL-XL) 100 MG 24 hr tablet Take 1 tablet (100 mg total) by mouth 2 (two) times daily. 60 tablet 1  . sildenafil (VIAGRA) 100 MG tablet Take 50 mg by mouth daily as needed.     No current facility-administered medications for this visit.     Allergies: Patient has no known allergies.  Past Medical History:  Diagnosis Date  . Chicken pox   . Diabetes mellitus without complication ([place])   . Hyperlipidemia   . Hypertension     History reviewed. No pertinent surgical history.  Family History  Problem Relation Age of Onset  . Hypertension Mother   . Hypertension Father   . Hypertension Maternal Grandmother   . Hypertension Maternal Grandfather   . Hypertension Paternal Grandmother   . Diabetes Paternal Grandmother   . Hypertension Paternal Grandfather     Social History   Tobacco Use  . Smoking status: Current Every Day Smoker    Packs/day: 0.25    Years: 20.00    Pack  years: 5.00  . Smokeless tobacco: Never Used  Substance Use Topics  . Alcohol use: No     Subjective:  Bryan Webb is here today to establish care, transferring from prior provider in the same clinic. Aside from primary care, he is not routinely followed by any specialists. 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 He has not been to our clinic or had medication refills here in quite some time, he says the nurse at work gives him some medication refills and checks A1c routinely, last checked last month with reading of 8.4, but he has also tells me hes had to go to ER for medication refills a few times over past few years.  Hypertension -maintained on amlodipine 10, Metoprolol 100 BID, losartan 100 daily, and hydralazine 10 QID was added at an ER visit for HTN on 09/02/18. He tells me that he takes all medications daily as instructed but does not like hydralazine, makes him feel tired. Does check BP at home, recent home readings 170s/90s Denies weakness, syncope, vision changes, chest pain, shortness of breath, edema. He does have prescription for lasix 40 prn edema, which he takes as needed about 1-2 times per month for LE edema with good improvement in swelling.  BP Readings from Last 3 Encounters:  09/21/18 (!) 160/92  09/02/18 (!) 183/96  02/22/18 (!) 175/96    Diabetes- maintained on metformin 500 once daily, lantus 20 units qhs was started at an ER visit due last year due to uncontrolled glucose, however he tells me he needs to come off insulin for DOT clearance. Reports daily medication compliance without adverse medication effects. He says he regularly checks home glucose, recent readings 130s  Denies hypoglycemia, tremor, diaphoresis, polyuria, polydipsia, polyphagia.  Lab Results  Component Value Date   HGBA1C 8.9 (H) 09/21/2018    ROS- See HPI   Objective:  Vitals:   09/21/18 1530   BP: (!) 160/92  Pulse: (!) 56  SpO2: 98%  Weight: 198 lb (89.8 kg)  Height: 5\' 6"  (1.676 m)  HR stable, on metoprolol  General: Well developed, well nourished, in no acute distress  Skin : Warm and dry.  Head: Normocephalic and atraumatic  Eyes: Sclera and conjunctiva clear; pupils round and reactive to light; extraocular movements intact  Oropharynx: Pink, supple. No suspicious lesions  Neck: Supple without thyromegaly Lungs: Respirations unlabored; clear to auscultation bilaterally without wheeze, rales, rhonchi  CVS exam: bradycardic rate and regular rhythm, S1 and S2 normal.   Extremities: No edema, cyanosis, clubbing  Vessels: Symmetric bilaterally  Neurologic: Alert and oriented; speech intact; face symmetrical; moves all extremities well; CNII-XII intact without focal deficit  Psychiatric: Normal mood and affect.   Assessment:  1. Essential hypertension   2. Type 2 diabetes mellitus without complication, with long-term current use of insulin (HCC)   3. Edema, unspecified type     Plan:   BP and diabetes are not controlled Edema is stable At this point, It is Unclear where or if he is getting medication refills, his last visit here was 04/2017 and many of his medications by prior PCP do not have current refills, although he is adamant that he is taking all medications as currently listed, I have sent refills of BP medications today with instructions to resume daily- no hydralazine refill sent due to patient intolerance We will update labs then follow up with further plan for his BP and diabetes management I have asked him to RTC in 1 month with all medication bottles for follow up visit  Return in about 1 month (around 10/22/2018) for F/U: DM, HTN, medication rec.  Orders Placed This Encounter  Procedures  . Comprehensive metabolic panel    Standing Status:   Future    Number of Occurrences:   1    Standing Expiration Date:   09/22/2019  . CBC    Standing Status:   Future     Number of Occurrences:   1    Standing Expiration Date:   09/22/2019  . Hemoglobin A1c    Standing Status:   Future    Number of Occurrences:   1    Standing Expiration Date:   09/22/2019  . Lipid panel    Standing Status:   Future    Number of Occurrences:   1    Standing Expiration Date:   09/22/2019    Requested Prescriptions   Signed Prescriptions Disp Refills  . metoprolol succinate (TOPROL-XL) 100 MG 24 hr tablet 60 tablet 1    Sig: Take 1 tablet (100 mg total) by mouth 2 (two) times daily.  Marland Kitchen losartan (COZAAR) 100 MG tablet 30 tablet 1    Sig: Take 1 tablet (100 mg total) by mouth daily.  Marland Kitchen amLODipine (  NORVASC) 10 MG tablet 30 tablet 1    Sig: Take 1 tablet (10 mg total) by mouth daily.

## 2018-09-21 NOTE — Patient Instructions (Addendum)
Please head downstairs for labs  Please pick up all medication refills and resume as instructed  I will see you in 1 month

## 2018-09-23 ENCOUNTER — Other Ambulatory Visit: Payer: Self-pay | Admitting: Nurse Practitioner

## 2018-09-23 DIAGNOSIS — N289 Disorder of kidney and ureter, unspecified: Secondary | ICD-10-CM

## 2018-09-23 DIAGNOSIS — E119 Type 2 diabetes mellitus without complications: Secondary | ICD-10-CM

## 2018-09-23 DIAGNOSIS — I10 Essential (primary) hypertension: Secondary | ICD-10-CM

## 2018-09-23 DIAGNOSIS — Z794 Long term (current) use of insulin: Secondary | ICD-10-CM

## 2018-09-23 DIAGNOSIS — R945 Abnormal results of liver function studies: Secondary | ICD-10-CM

## 2018-09-23 DIAGNOSIS — R7989 Other specified abnormal findings of blood chemistry: Secondary | ICD-10-CM

## 2018-09-23 MED ORDER — CLONIDINE HCL 0.1 MG PO TABS: 0.1000 mg | ORAL_TABLET | Freq: Every day | ORAL | 3 refills | Status: DC

## 2018-09-23 NOTE — Progress Notes (Signed)
orders

## 2018-09-28 ENCOUNTER — Ambulatory Visit
Admission: RE | Admit: 2018-09-28 | Discharge: 2018-09-28 | Disposition: A | Discharge status: Home / Self Care | Payer: BLUE CROSS/BLUE SHIELD | Source: Ambulatory Visit | Attending: Nurse Practitioner | Admitting: Nurse Practitioner

## 2018-09-28 DIAGNOSIS — K760 Fatty (change of) liver, not elsewhere classified: Secondary | ICD-10-CM | POA: Diagnosis not present

## 2018-09-28 DIAGNOSIS — R7989 Other specified abnormal findings of blood chemistry: Secondary | ICD-10-CM

## 2018-09-28 DIAGNOSIS — R945 Abnormal results of liver function studies: Principal | ICD-10-CM

## 2018-09-29 ENCOUNTER — Telehealth: Payer: Self-pay | Admitting: Nurse Practitioner

## 2018-09-29 NOTE — Telephone Encounter (Signed)
Pt called back.  Copied from [place] [phone]. Topic: Quick Communication - Other Results (Clinic Use ONLY) >> Sep 29, 2018  9:25 AM Cresenciano Lick, CMA wrote: Left message for pt to call back Re: recent ultrasound.  [facility] may inform patient of results.

## 2018-09-29 NOTE — Telephone Encounter (Signed)
Left message for pt to return call to office for lab results

## 2018-10-09 NOTE — Telephone Encounter (Signed)
Patient notified of [place] results- he will follow up with his other specialist. Patient asked question about health form for job- per note in chart- he will need to bring it in so PCP can look at it to see if she can fill it out. He will drop it off for review.

## 2018-10-14 ENCOUNTER — Encounter: Payer: Self-pay | Admitting: *Deleted

## 2018-10-23 ENCOUNTER — Other Ambulatory Visit: Payer: Self-pay

## 2018-10-23 ENCOUNTER — Encounter: Payer: Self-pay | Admitting: Endocrinology

## 2018-10-23 ENCOUNTER — Ambulatory Visit (INDEPENDENT_AMBULATORY_CARE_PROVIDER_SITE_OTHER): Payer: BLUE CROSS/BLUE SHIELD | Admitting: Endocrinology

## 2018-10-23 VITALS — BP 140/84 | HR 59 | Ht 66.0 in | Wt 200.2 lb

## 2018-10-23 DIAGNOSIS — Z794 Long term (current) use of insulin: Secondary | ICD-10-CM | POA: Diagnosis not present

## 2018-10-23 DIAGNOSIS — I10 Essential (primary) hypertension: Secondary | ICD-10-CM | POA: Diagnosis not present

## 2018-10-23 DIAGNOSIS — E782 Mixed hyperlipidemia: Secondary | ICD-10-CM | POA: Diagnosis not present

## 2018-10-23 DIAGNOSIS — E1165 Type 2 diabetes mellitus with hyperglycemia: Secondary | ICD-10-CM

## 2018-10-23 DIAGNOSIS — N184 Chronic kidney disease, stage 4 (severe): Secondary | ICD-10-CM

## 2018-10-23 LAB — GLUCOSE, POCT (MANUAL RESULT ENTRY): POC Glucose: 284 mg/dl — AB (ref 70–99)

## 2018-10-23 MED ORDER — ROSUVASTATIN CALCIUM 10 MG PO TABS: 10.0000 mg | ORAL_TABLET | Freq: Every day | ORAL | 3 refills | Status: DC

## 2018-10-23 MED ORDER — SEMAGLUTIDE 3 MG PO TABS: 1.0000 | ORAL_TABLET | Freq: Every day | ORAL | Status: DC

## 2018-10-23 NOTE — Patient Instructions (Signed)
Check blood sugars on waking up 2 days a week  Also check blood sugars about 2 hours after meals and do this after different meals by rotation  Recommended blood sugar levels on waking up are 90-130 and about 2 hours after meal is 130-160  Please bring your blood sugar monitor to each visit, thank you  Low fat diet  Stop  Metformin

## 2018-10-23 NOTE — Progress Notes (Signed)
Patient ID: Bryan Webb, male   DOB: March 18, 1971, 48 y.o.   MRN: [id number]           Reason for Appointment: Consultation for Type 2 Diabetes  Referring WUJ:[id number], Delphia Grates, NP   History of Present Illness:          Date of diagnosis of type 2 diabetes mellitus: 07/2016       Background history:   He was initially diagnosed at an emergency room when he presented with high blood sugars He was initially given Lantus insulin but he was switched to Trulicity by his PCP He says he had better blood sugars with Trulicity and his G9F had gone down to 7.4 However not clear why he was put back on insulin after about 3 months and also Metformin He could not tolerate more than 1 tablet of 500 mg metformin ER  Recent history:   Most recent A1c 8.9 is done on 09/21/2018  INSULIN regimen is: Was on Lantus 20 units daily    Non-insulin hypoglycemic drugs the patient is taking are: Metformin ER 500 mg daily  Current management, blood sugar patterns and problems identified:  He stopped taking his Lantus insulin about 6 months ago as he did not want to do any injections and is only on metformin  He has had very sporadic and erratic follow-up for his diabetes and has had A1c only twice in the last 2 years, recently last month  His blood sugars in the lab appear to be variable over 284 today after eating  Also does not check his blood sugars regularly and has not done this for probably a month  Also checking mostly in the morning readings which are not high usually   He says he is not having any unusual fatigue, increased thirst or urination currently  Does not have any meal plan and not restricting any food items in his diet        Side effects from medications have been: None     Meal times are:  Lunch: Dinner:    Typical meal intake: Breakfast is skipped, No sugar drinks               Exercise: walks at work    Glucose monitoring:  done less than 1 times a day          Glucometer: ?       Blood Glucose readings by   recall about 133-145 fasting and may be 200 after meals   Dietician visit, most recent: Never  Weight history: <240  Wt Readings from Last 3 Encounters:  10/23/18 200 lb 3.2 oz (90.8 kg)  09/21/18 198 lb (89.8 kg)  09/02/18 202 lb (91.6 kg)    Glycemic control:   Lab Results  Component Value Date   HGBA1C 8.9 (H) 09/21/2018   HGBA1C 9.1 (H) 04/17/2017   HGBA1C 7.4 (H) 01/03/2017   Lab Results  Component Value Date   MICROALBUR 1.3 04/17/2017   CREATININE 2.38 (H) 09/21/2018   Lab Results  Component Value Date   MICRALBCREAT 2.1 04/17/2017    No results found for: FRUCTOSAMINE  Office Visit on 10/23/2018  Component Date Value Ref Range Status  . POC Glucose 10/23/2018 284* 70 - 99 mg/dl Final    Allergies as of 10/23/2018   No Known Allergies     Medication List       Accurate as of October 23, 2018  3:09 PM. Always use your most recent med  list.        amLODipine 10 MG tablet Commonly known as:  NORVASC Take 1 tablet (10 mg total) by mouth daily.   cloNIDine 0.1 MG tablet Commonly known as:  CATAPRES Take 1 tablet (0.1 mg total) by mouth daily.   furosemide 40 MG tablet Commonly known as:  LASIX Take 40 mg by mouth daily as needed for fluid.   losartan 100 MG tablet Commonly known as:  COZAAR Take 1 tablet (100 mg total) by mouth daily.   metFORMIN 500 MG 24 hr tablet Commonly known as:  GLUCOPHAGE XR Take 1 tablet (500 mg total) by mouth daily with breakfast.   metoprolol succinate 100 MG 24 hr tablet Commonly known as:  TOPROL-XL Take 1 tablet (100 mg total) by mouth 2 (two) times daily.   rosuvastatin 10 MG tablet Commonly known as:  CRESTOR Take 1 tablet (10 mg total) by mouth daily.   Semaglutide 3 MG Tabs Commonly known as:  RYBELSUS Take 1 tablet by mouth daily. TAKE 1 TABLET BY MOUTH ONCE DAILY ON AN EMPTY STOMACH WITH NO MORE THAN 4 OZ OF WATER. WAIT 30 MIN. BEFORE TAKING OTHER MEDS  OR CONSUMING FOOD. (sample. [id number] ex:11/21)   sildenafil 100 MG tablet Commonly known as:  VIAGRA Take 50 mg by mouth daily as needed.       Allergies: No Known Allergies  Past Medical History:  Diagnosis Date  . Chicken pox   . Diabetes mellitus without complication ([place])   . Hyperlipidemia   . Hypertension     History reviewed. No pertinent surgical history.  Family History  Problem Relation Age of Onset  . Hypertension Mother   . Hypertension Father   . Hypertension Maternal Grandmother   . Hypertension Maternal Grandfather   . Hypertension Paternal Grandmother   . Diabetes Paternal Grandmother   . Hypertension Paternal Grandfather     Social History:  reports that he has been smoking. He has a 5.00 pack-year smoking history. He has never used smokeless tobacco. He reports that he does not drink alcohol or use drugs.   Review of Systems  Constitutional: Negative for weight loss.  HENT: Negative for headaches.   Eyes: Negative for blurred vision.  Respiratory: Negative for shortness of breath.   Cardiovascular: Negative for chest pain, leg swelling and claudication.  Gastrointestinal: Negative for diarrhea.  Endocrine: Positive for erectile dysfunction. Negative for fatigue and polydipsia.  Genitourinary: Positive for nocturia.  Musculoskeletal: Negative for joint pain.  Skin: Negative for rash.  Neurological: Negative for numbness.     Lipid history: Recently told to get back on atorvastatin 40 mg but he takes this irregularly because he thinks it causes constipation    Lab Results  Component Value Date   CHOL 274 (H) 09/21/2018   HDL 32.70 (L) 09/21/2018   LDLDIRECT 178.0 09/21/2018   TRIG 244.0 (H) 09/21/2018   CHOLHDL 8 09/21/2018           Hypertension: Has been treated with losartan 100 mg, clonidine 0.1, metoprolol 100 mg and amlodipine 10 mg daily On treatment since  ? 2010   Home BP is checked daily, he thinks it is usually about 140/85    BP Readings from Last 3 Encounters:  10/23/18 140/84  09/21/18 (!) 160/92  09/02/18 (!) 183/96    Most recent eye exam was in 11/18  Most recent foot exam: 10/2018  Currently known complications of diabetes: Erectile dysfunction, unknown status of microalbumin  LABS:  Office  Visit on 10/23/2018  Component Date Value Ref Range Status  . POC Glucose 10/23/2018 284* 70 - 99 mg/dl Final    Physical Examination:  BP 140/84 (BP Location: Left Arm, Patient Position: Sitting, Cuff Size: Normal)   Pulse (!) 59   Ht 5\' 6"  (1.676 m)   Wt 200 lb 3.2 oz (90.8 kg)   SpO2 97%   BMI 32.31 kg/m   GENERAL:         Patient has mild abdominal obesity.    HEENT:         Eye exam shows normal external appearance.  Fundus exam shows no retinopathy.  Oral exam shows normal mucosa .   NECK:   There is no lymphadenopathy  Thyroid is not enlarged and no nodules felt.   Carotids are normal to palpation and no bruit heard  LUNGS:         Chest is symmetrical. Lungs are clear to auscultation.Marland Kitchen   HEART:         Heart sounds:  S1 and S2 are normal. No murmur or click heard., no S3 or S4.   ABDOMEN:   There is no distention present. Liver and spleen are not palpable.  No other mass or tenderness present.    NEUROLOGICAL:   Ankle jerks are absent bilaterally.   Biceps reflexes are brisk.  Diabetic Foot Exam - Simple   Simple Foot Form Diabetic Foot exam was performed with the following findings:  Yes   Visual Inspection No deformities, no ulcerations, no other skin breakdown bilaterally:  Yes Sensation Testing Intact to touch and monofilament testing bilaterally:  Yes Pulse Check Posterior Tibialis and Dorsalis pulse intact bilaterally:  Yes Comments            Vibration sense is mildly reduced in distal first toes.  MUSCULOSKELETAL:  There is no swelling or deformity of the peripheral joints.     EXTREMITIES:     There is no ankle edema.  SKIN:       No rash.  Has significant  dryness of the skin on feet and lower legs      ASSESSMENT:  Diabetes type 2, uncontrolled  See history of present illness for detailed discussion of current diabetes management, blood sugar patterns and problems identified  Recent A1c of 8.9 indicates poor control  Current treatment regimen is only 500 mg metformin Patient is very regular with his follow-up with has had no consistent treatment However in the past he reportedly had better blood sugar control when he was taking Trulicity which he has not taken since 2018 Also has not tried any other treatments except insulin which he has not taken on his own Also with his poor renal function he is not a candidate for continuing metformin He is not understanding the need for consistent with good control and A1c target of 7 Has no meal plan to follow and has had no diabetes education Currently checking blood sugars very erratically and usually not after meals  Discussed with the patient that his blood sugar of 284 indicates significant hyperglycemia especially postprandial which needs to be controlled both with dietary changes and medications   Complications of diabetes: Erectile dysfunction  Hypercholesterolemia: He has significant hyperlipidemia and has been reportedly getting side effects from Lipitor which he does not take regularly and has not discussed this with his PCP  HYPERTENSION: Although his office readings are high he thinks his blood pressures are better at home and checked regularly Blood pressure is  mildly high in the office today, currently on multiple medications  CHRONIC kidney disease with creatinine 2.3.  Not followed by nephrologist and likely needs to be referred for overall management and long-term follow-up  PLAN:    1. Glucose monitoring: . Patient advised to check readings at least once a day either fasting or 2 hours after meals.  He will let us know which meter he is using and needs to bring his meter or  blood sugar diary for review on each visit  2.  Diabetes education: . Patient will need consultation with dietitian and he will be referred for this, discussed the need for establishing a meal plan for improved blood sugar control long-term  3.  Lifestyle changes: . Dietary changes: Reduce high-fat foods . Exercise regimen: Continue regular walking at work and may add more aerobic exercise on his days off  4.  Medication changes needed: . Stop metformin because of renal failure Discussed with the patient the nature of GLP-1 drugs, the action on various organ systems, improved satiety, how they benefit blood glucose control, as well as the benefit of weight loss.  He will start RYBELSUS for convenience of oral formulation which he prefers and also potential long-term benefits of cardiovascular risk reduction  Explained possible side effects of RYBELSUS, most commonly nausea that usually improves over time; discussed safety information in package insert.  Explained to him that he needs to take this with small amount of water before breakfast To start with 3 mg dosage weekly for the first 4 weeks and then 7 mg dose if tolerated Have advised him to call when he is about to finish his 3 mg prescription to get new dosage  Patient was given a sample of the 3 mg for 30 days  Patient brochure on Rybelsus with enclosed co-pay card given His control will be reassessed on follow-up and additional medications may need to be added also   5.  Preventive care needed:  . Annual eye exam . Urine microalbumin when blood sugars are controlled  6.  Follow-up: 6 weeks  HYPERTENSION: He will continue to follow-up with PCP  LIPIDS: He will stop atorvastatin since he cannot tolerate this and start Crestor 10 mg daily for now    Patient Instructions  Check blood sugars on waking up 2 days a week  Also check blood sugars about 2 hours after meals and do this after different meals by  rotation  Recommended blood sugar levels on waking up are 90-130 and about 2 hours after meal is 130-160  Please bring your blood sugar monitor to each visit, thank you  Low fat diet  Stop  Metformin    Counseling time on subjects discussed in assessment and plan sections is over 50% of today's 60 minute visit   Consultation note has been sent to the referring physician  Elayne Snare 10/23/2018, 3:09 PM   Note: This office note was prepared with Dragon voice recognition system technology. Any transcriptional errors that result from this process are unintentional.

## 2018-10-28 ENCOUNTER — Telehealth: Payer: Self-pay | Admitting: Nurse Practitioner

## 2018-10-28 NOTE — Telephone Encounter (Signed)
Patient has dropped off DOT medical Examiner letter to be completed by [facility].  Patient states he has already had his DOT physical.  But the health history in regards to diabetes needs to be completed.  Placing in Brittany's. Box.

## 2018-10-29 NOTE — Telephone Encounter (Signed)
Form has been completed & placed in providers box to review and sign.

## 2018-10-30 DIAGNOSIS — Z0279 Encounter for issue of other medical certificate: Secondary | ICD-10-CM

## 2018-10-30 NOTE — Telephone Encounter (Signed)
Form signed, Faxed to [phone], Copy sent to scan &charged for.   LVM to inform patient and original is ready to be picked up.

## 2018-11-02 NOTE — Telephone Encounter (Signed)
Labs printed, upfront for p/u. Left detailed message informing pt.

## 2018-11-02 NOTE — Telephone Encounter (Signed)
Pt called in, he says that he now need a copy of his most recent labs. Pt will come in to pick up when ready.

## 2018-12-01 ENCOUNTER — Other Ambulatory Visit: Payer: BLUE CROSS/BLUE SHIELD

## 2018-12-02 ENCOUNTER — Other Ambulatory Visit: Payer: Self-pay

## 2018-12-02 ENCOUNTER — Other Ambulatory Visit (INDEPENDENT_AMBULATORY_CARE_PROVIDER_SITE_OTHER): Payer: BLUE CROSS/BLUE SHIELD

## 2018-12-02 DIAGNOSIS — Z794 Long term (current) use of insulin: Secondary | ICD-10-CM

## 2018-12-02 DIAGNOSIS — E782 Mixed hyperlipidemia: Secondary | ICD-10-CM

## 2018-12-02 DIAGNOSIS — E1165 Type 2 diabetes mellitus with hyperglycemia: Secondary | ICD-10-CM

## 2018-12-02 LAB — COMPREHENSIVE METABOLIC PANEL
ALK PHOS: 127 U/L — AB (ref 39–117)
ALT: 49 U/L (ref 0–53)
AST: 47 U/L — ABNORMAL HIGH (ref 0–37)
Albumin: 4.4 g/dL (ref 3.5–5.2)
BUN: 46 mg/dL — AB (ref 6–23)
CHLORIDE: 101 meq/L (ref 96–112)
CO2: 24 mEq/L (ref 19–32)
Calcium: 9.8 mg/dL (ref 8.4–10.5)
Creatinine, Ser: 2.75 mg/dL — ABNORMAL HIGH (ref 0.40–1.50)
GFR: 29.88 mL/min — AB (ref >60.00)
GLUCOSE: 158 mg/dL — AB (ref 70–99)
POTASSIUM: 4.8 meq/L (ref 3.5–5.1)
SODIUM: 133 meq/L — AB (ref 135–145)
Total Bilirubin: 0.9 mg/dL (ref 0.2–1.2)
Total Protein: 8.5 g/dL — ABNORMAL HIGH (ref 6.0–8.3)

## 2018-12-02 LAB — MICROALBUMIN / CREATININE URINE RATIO
CREATININE, U: 187.5 mg/dL
MICROALB UR: 12.9 mg/dL — AB (ref 0.0–1.9)
MICROALB/CREAT RATIO: 6.9 mg/g (ref 0.0–30.0)

## 2018-12-02 LAB — LIPID PANEL
Cholesterol: 224 mg/dL — ABNORMAL HIGH (ref 0–200)
HDL: 34.3 mg/dL — AB (ref >39.00)
LDL CALC: 160 mg/dL — AB (ref 0–99)
NONHDL: 189.75
Total CHOL/HDL Ratio: 7
Triglycerides: 149 mg/dL (ref 0.0–149.0)
VLDL: 29.8 mg/dL (ref 0.0–40.0)

## 2018-12-03 LAB — FRUCTOSAMINE: Fructosamine: 341 umol/L — ABNORMAL HIGH (ref 0–285)

## 2018-12-04 ENCOUNTER — Ambulatory Visit: Payer: BLUE CROSS/BLUE SHIELD | Admitting: Endocrinology

## 2018-12-04 ENCOUNTER — Encounter: Payer: Self-pay | Admitting: Endocrinology

## 2018-12-04 ENCOUNTER — Other Ambulatory Visit: Payer: Self-pay

## 2018-12-04 VITALS — BP 122/70 | HR 67 | Temp 98.7°F | Ht 66.0 in | Wt 205.6 lb

## 2018-12-04 DIAGNOSIS — E782 Mixed hyperlipidemia: Secondary | ICD-10-CM | POA: Diagnosis not present

## 2018-12-04 DIAGNOSIS — E1165 Type 2 diabetes mellitus with hyperglycemia: Secondary | ICD-10-CM

## 2018-12-04 MED ORDER — SEMAGLUTIDE 7 MG PO TABS: 1.0000 | ORAL_TABLET | Freq: Every day | ORAL | 3 refills | Status: DC

## 2018-12-04 NOTE — Progress Notes (Signed)
Patient ID: Bryan Webb, male   DOB: [birth year], 48 y.o.   MRN: [id number]           Reason for Appointment: for Type 2 Diabetes  Referring WVP:[id number], Delphia Grates, NP   History of Present Illness:          Date of diagnosis of type 2 diabetes mellitus: 07/2016       Background history:   He was initially diagnosed at an emergency room when he presented with high blood sugars He was initially given Lantus insulin but he was switched to Trulicity by his PCP He says he had better blood sugars with Trulicity and his W5I had gone down to 7.4 However not clear why he was put back on insulin after about 3 months and also Metformin He could not tolerate more than 1 tablet of 500 mg metformin ER  Recent history:   Most recent A1c 8.9 is done on 09/21/2018  Non-insulin hypoglycemic drugs the patient is taking are: Rybelsus 3 mg daily  Current management, blood sugar patterns and problems identified:  He was given Rybelsus 3 mg but he was having nausea in the first few days which was significant but gradually this has improved  However he appears not to have taking this every day since he was 30 tablets have still not been finished 6 weeks later  He is still waiting for appointment with dietitian  Again did not bring his blood sugar monitor  However he thinks his blood sugars are better, frequently were over 200 previously and now he thinks they are not over 150 usually even after meals  Lab blood sugar in the afternoon fasting was however 153 He appears to be gaining weight despite stating that he is very active at work       Side effects from medications have been: None     Typical meal intake: Breakfast is skipped             Exercise: walks at work    Glucose monitoring:  done less than 1 times a day         Glucometer: ?       Blood Glucose readings by   recall 125-150   Dietician visit, most recent: Never  Weight history: <240  Wt Readings from Last 3  Encounters:  12/04/18 205 lb 9.6 oz (93.3 kg)  10/23/18 200 lb 3.2 oz (90.8 kg)  09/21/18 198 lb (89.8 kg)    Glycemic control:   Lab Results  Component Value Date   HGBA1C 8.9 (H) 09/21/2018   HGBA1C 9.1 (H) 04/17/2017   HGBA1C 7.4 (H) 01/03/2017   Lab Results  Component Value Date   MICROALBUR 12.9 (H) 12/02/2018   LDLCALC 160 (H) 12/02/2018   CREATININE 2.75 (H) 12/02/2018   Lab Results  Component Value Date   MICRALBCREAT 6.9 12/02/2018    Lab Results  Component Value Date   FRUCTOSAMINE 341 (H) 12/02/2018    Lab on 12/02/2018  Component Date Value Ref Range Status  . Microalb, Ur 12/02/2018 12.9* 0.0 - 1.9 mg/dL Final  . Creatinine,U 12/02/2018 187.5  mg/dL Final  . Microalb Creat Ratio 12/02/2018 6.9  0.0 - 30.0 mg/g Final  . Fructosamine 12/02/2018 341* 0 - 285 umol/L Final   Comment: Published reference interval for apparently healthy subjects between age 67 and 28 is 11 - 285 umol/L and in a poorly controlled diabetic population is 228 - 563 umol/L with a mean of 396 umol/L.   Marland Kitchen  Sodium 12/02/2018 133* 135 - 145 mEq/L Final  . Potassium 12/02/2018 4.8  3.5 - 5.1 mEq/L Final  . Chloride 12/02/2018 101  96 - 112 mEq/L Final  . CO2 12/02/2018 24  19 - 32 mEq/L Final  . Glucose, Bld 12/02/2018 158* 70 - 99 mg/dL Final  . BUN 12/02/2018 46* 6 - 23 mg/dL Final  . Creatinine, Ser 12/02/2018 2.75* 0.40 - 1.50 mg/dL Final  . Total Bilirubin 12/02/2018 0.9  0.2 - 1.2 mg/dL Final  . Alkaline Phosphatase 12/02/2018 127* 39 - 117 U/L Final  . AST 12/02/2018 47* 0 - 37 U/L Final  . ALT 12/02/2018 49  0 - 53 U/L Final  . Total Protein 12/02/2018 8.5* 6.0 - 8.3 g/dL Final  . Albumin 12/02/2018 4.4  3.5 - 5.2 g/dL Final  . Calcium 12/02/2018 9.8  8.4 - 10.5 mg/dL Final  . GFR 12/02/2018 29.88* >60.00 mL/min Final  . Cholesterol 12/02/2018 224* 0 - 200 mg/dL Final   ATP III Classification       Desirable:  < 200 mg/dL               Borderline High:  200 - 239 mg/dL           High:  > = 240 mg/dL  . Triglycerides 12/02/2018 149.0  0.0 - 149.0 mg/dL Final   Normal:  <150 mg/dLBorderline High:  150 - 199 mg/dL  . HDL 12/02/2018 34.30* >39.00 mg/dL Final  . VLDL 12/02/2018 29.8  0.0 - 40.0 mg/dL Final  . LDL Cholesterol 12/02/2018 160* 0 - 99 mg/dL Final  . Total CHOL/HDL Ratio 12/02/2018 7   Final                  Men          Women1/2 Average Risk     3.4          3.3Average Risk          5.0          4.42X Average Risk          9.6          7.13X Average Risk          15.0          11.0                      . NonHDL 12/02/2018 189.75   Final   NOTE:  Non-HDL goal should be 30 mg/dL higher than patient's LDL goal (i.e. LDL goal of < 70 mg/dL, would have non-HDL goal of < 100 mg/dL)    Allergies as of 12/04/2018   No Known Allergies     Medication List       Accurate as of December 04, 2018  2:40 PM. Always use your most recent med list.        amLODipine 10 MG tablet Commonly known as:  NORVASC Take 1 tablet (10 mg total) by mouth daily.   cloNIDine 0.1 MG tablet Commonly known as:  CATAPRES Take 1 tablet (0.1 mg total) by mouth daily.   furosemide 40 MG tablet Commonly known as:  LASIX Take 40 mg by mouth daily as needed for fluid.   losartan 100 MG tablet Commonly known as:  COZAAR Take 1 tablet (100 mg total) by mouth daily.   metFORMIN 500 MG 24 hr tablet Commonly known as:  Glucophage XR Take 1 tablet (500 mg total) by mouth  daily with breakfast.   metoprolol succinate 100 MG 24 hr tablet Commonly known as:  TOPROL-XL Take 1 tablet (100 mg total) by mouth 2 (two) times daily.   rosuvastatin 10 MG tablet Commonly known as:  Crestor Take 1 tablet (10 mg total) by mouth daily.   Semaglutide 3 MG Tabs Commonly known as:  Rybelsus Take 1 tablet by mouth daily. TAKE 1 TABLET BY MOUTH ONCE DAILY ON AN EMPTY STOMACH WITH NO MORE THAN 4 OZ OF WATER. WAIT 30 MIN. BEFORE TAKING OTHER MEDS OR CONSUMING FOOD. (sample. [id number] ex:11/21)    sildenafil 100 MG tablet Commonly known as:  VIAGRA Take 50 mg by mouth daily as needed.       Allergies: No Known Allergies  Past Medical History:  Diagnosis Date  . Chicken pox   . Diabetes mellitus without complication ([place])   . Hyperlipidemia   . Hypertension     History reviewed. No pertinent surgical history.  Family History  Problem Relation Age of Onset  . Hypertension Mother   . Hypertension Father   . Hypertension Maternal Grandmother   . Hypertension Maternal Grandfather   . Hypertension Paternal Grandmother   . Diabetes Paternal Grandmother   . Hypertension Paternal Grandfather     Social History:  reports that he has been smoking. He has a 5.00 pack-year smoking history. He has never used smokeless tobacco. He reports that he does not drink alcohol or use drugs.   Review of Systems   Lipid history: He was started on Crestor as he was not tolerating Lipitor but does not appear to be taking it regularly as his LDL is still up    Lab Results  Component Value Date   CHOL 224 (H) 12/02/2018   HDL 34.30 (L) 12/02/2018   LDLCALC 160 (H) 12/02/2018   LDLDIRECT 178.0 09/21/2018   TRIG 149.0 12/02/2018   CHOLHDL 7 12/02/2018           Hypertension: Has been treated with losartan 100 mg, clonidine 0.1, metoprolol 100 mg and amlodipine 10 mg daily On treatment since  ? 2010   Home BP is checked daily,    BP Readings from Last 3 Encounters:  12/04/18 122/70  10/23/18 140/84  09/21/18 (!) 160/92    Most recent eye exam was in 11/18  Most recent foot exam: 10/2018  Currently known complications of diabetes: Erectile dysfunction  Chronic kidney disease: Is due to see nephrologist  Lab Results  Component Value Date   CREATININE 2.75 (H) 12/02/2018   CREATININE 2.38 (H) 09/21/2018   CREATININE 1.98 (H) 09/02/2018     LABS:  Lab on 12/02/2018  Component Date Value Ref Range Status  . Microalb, Ur 12/02/2018 12.9* 0.0 - 1.9 mg/dL Final  .  Creatinine,U 12/02/2018 187.5  mg/dL Final  . Microalb Creat Ratio 12/02/2018 6.9  0.0 - 30.0 mg/g Final  . Fructosamine 12/02/2018 341* 0 - 285 umol/L Final   Comment: Published reference interval for apparently healthy subjects between age 53 and 23 is 59 - 285 umol/L and in a poorly controlled diabetic population is 228 - 563 umol/L with a mean of 396 umol/L.   Marland Kitchen Sodium 12/02/2018 133* 135 - 145 mEq/L Final  . Potassium 12/02/2018 4.8  3.5 - 5.1 mEq/L Final  . Chloride 12/02/2018 101  96 - 112 mEq/L Final  . CO2 12/02/2018 24  19 - 32 mEq/L Final  . Glucose, Bld 12/02/2018 158* 70 - 99 mg/dL Final  . BUN  12/02/2018 46* 6 - 23 mg/dL Final  . Creatinine, Ser 12/02/2018 2.75* 0.40 - 1.50 mg/dL Final  . Total Bilirubin 12/02/2018 0.9  0.2 - 1.2 mg/dL Final  . Alkaline Phosphatase 12/02/2018 127* 39 - 117 U/L Final  . AST 12/02/2018 47* 0 - 37 U/L Final  . ALT 12/02/2018 49  0 - 53 U/L Final  . Total Protein 12/02/2018 8.5* 6.0 - 8.3 g/dL Final  . Albumin 12/02/2018 4.4  3.5 - 5.2 g/dL Final  . Calcium 12/02/2018 9.8  8.4 - 10.5 mg/dL Final  . GFR 12/02/2018 29.88* >60.00 mL/min Final  . Cholesterol 12/02/2018 224* 0 - 200 mg/dL Final   ATP III Classification       Desirable:  < 200 mg/dL               Borderline High:  200 - 239 mg/dL          High:  > = 240 mg/dL  . Triglycerides 12/02/2018 149.0  0.0 - 149.0 mg/dL Final   Normal:  <150 mg/dLBorderline High:  150 - 199 mg/dL  . HDL 12/02/2018 34.30* >39.00 mg/dL Final  . VLDL 12/02/2018 29.8  0.0 - 40.0 mg/dL Final  . LDL Cholesterol 12/02/2018 160* 0 - 99 mg/dL Final  . Total CHOL/HDL Ratio 12/02/2018 7   Final                  Men          Women1/2 Average Risk     3.4          3.3Average Risk          5.0          4.42X Average Risk          9.6          7.13X Average Risk          15.0          11.0                      . NonHDL 12/02/2018 189.75   Final   NOTE:  Non-HDL goal should be 30 mg/dL higher than patient's LDL goal  (i.e. LDL goal of < 70 mg/dL, would have non-HDL goal of < 100 mg/dL)    Physical Examination:  BP 122/70 (BP Location: Left Arm, Patient Position: Sitting, Cuff Size: Normal)   Pulse 67   Temp 98.7 F (37.1 C) (Oral)   Ht 5\' 6"  (1.676 m)   Wt 205 lb 9.6 oz (93.3 kg)   SpO2 99%   BMI 33.18 kg/m        ASSESSMENT:  Diabetes type 2, recent BMI 33  See history of present illness for detailed discussion of current diabetes management, blood sugar patterns and problems identified  Last A1c of 8.9 indicates poor control prior to his consultation  Current treatment regimen is Rybelsus 3 mg daily  He has been able to adjust to the nausea with this medication but has not taken it regularly His blood sugars appear to be significantly better overall but difficult to assess since he did not bring his meter Fructosamine of 341 still indicates inadequate control  Complications of diabetes: Erectile dysfunction  Hypercholesterolemia: He has significant hyperlipidemia and has not been taking his Crestor regularly with LDL 160  HYPERTENSION: Blood pressure is better today  CHRONIC kidney disease: Waiting for nephrology consultation   PLAN:    1. Glucose monitoring: .  Patient advised to check readings regularly either fasting or 2 hours after meals.  He will let us know which meter he is using and needs to bring his meter  for review on each visit  2.  Diabetes education: Patient will need consultation with dietitian and this has been scheduled  3.  Lifestyle changes: . Continue improving diet with low-fat foods   4.  Medication changes needed: Rybelsus 7 mg daily in the morning Needs to take this every day Will possibly need additional medications if continues to have high readings Hopefully will do better with improved diet also   5.  Preventive care needed:  . Annual eye exam     HYPERTENSION: He will continue to follow-up with PCP and nephrologist  LIPIDS: He will  take his Crestor daily Discussed lipid targets      There are no Patient Instructions on file for this visit. Counseling time on subjects discussed in assessment and plan sections is over 50% of today's 60 minute visit   Consultation note has been sent to the referring physician  Elayne Snare 12/04/2018, 2:40 PM   Note: This office note was prepared with Dragon voice recognition system technology. Any transcriptional errors that result from this process are unintentional.

## 2018-12-04 NOTE — Patient Instructions (Addendum)
Check blood sugars on waking up 3 days a week  Also check blood sugars about 2 hours after meals and do this after different meals by rotation  Recommended blood sugar levels on waking up are 90-130 and about 2 hours after meal is 130-160  Please bring your blood sugar monitor to each visit, thank you  Rybelsus 7mg 

## 2018-12-18 ENCOUNTER — Encounter: Payer: Self-pay | Admitting: Dietician

## 2018-12-18 ENCOUNTER — Other Ambulatory Visit: Payer: Self-pay

## 2018-12-18 ENCOUNTER — Encounter: Payer: BLUE CROSS/BLUE SHIELD | Attending: Endocrinology | Admitting: Dietician

## 2018-12-18 DIAGNOSIS — Z794 Long term (current) use of insulin: Secondary | ICD-10-CM | POA: Diagnosis not present

## 2018-12-18 DIAGNOSIS — E119 Type 2 diabetes mellitus without complications: Secondary | ICD-10-CM

## 2018-12-18 NOTE — Progress Notes (Signed)
Diabetes Self-Management Education  Visit Type: First/Initial  Appt. Start Time: 1300 Appt. End Time: 1430  12/18/2018  Mr. Bryan Webb, identified by name and date of birth, is a 48 y.o. male with a diagnosis of Diabetes: Type 2.   ASSESSMENT Patient is here today alone.  He would like to learn to eat right to control his blood sugar and cholesterol.  He missed his appointment at his nephrologist and is waiting for a call back.   History includes type 2 diabetes diagnosed 07/2016, HDL, HTN, CKD, smokes, and ED. Medications include Rybelsus 7 mg and is tolerating well.  He does not take Lasix at this time. Labs 3/18 include:  BUN/Creatinine:  76/2.75, Potassium 4.8, GFR 29, cholesterol 224, HDL 34, LDL 160, Triglycerides 147 and A1C 09/21/18 was 8.9%.  He lives with his mother (has diabetes and cooks a lot of vegetables) and brother (Physiological scientist).  He has a 70 yo son and wife but visits them.  His mother does most of the shopping and cooking.  His mom does not cook with salt but he adds salt.  Rarely eats out.  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  Starts work at 5:30-3 and then usually drives school bus until 7 pm.    Weight 206 lb (93.4 kg).  His weight continues to slowly increase.  Question fluid as well.  He would like to lose weight.  Kidney function poor. Body mass index is 33.25 kg/m.  Diabetes Self-Management Education - 12/18/18 1328      Visit Information   Visit Type  First/Initial      Initial Visit   Diabetes Type  Type 2    Are you currently following a meal plan?  No    Are you taking your medications as prescribed?  Yes    Date Diagnosed  07/2016      Health Coping   How would you rate your overall health?  Fair      Psychosocial Assessment   Patient Belief/Attitude about Diabetes  Motivated to manage diabetes    Self-care barriers  None    Self-management support  Doctor's office;Family    Other persons present  Patient    Patient Concerns  Nutrition/Meal planning;Problem Solving;Glycemic Control;Healthy Lifestyle;Weight Control    Special Needs  None    Preferred Learning Style  No preference indicated    Learning Readiness  Ready    How often do you need to have someone help you when you read instructions, pamphlets, or other written materials from your doctor or pharmacy?  1 - Never    What is the last grade level you completed in school?  12th grade      Pre-Education Assessment   Patient understands the diabetes disease and treatment process.  Needs Instruction    Patient understands incorporating nutritional management into lifestyle.  Needs Instruction    Patient undertands incorporating physical activity into lifestyle.  Needs Instruction    Patient understands using medications safely.  Needs Instruction    Patient understands monitoring blood glucose, interpreting and using results  Needs Instruction    Patient understands prevention, detection, and treatment of acute complications.  Needs Instruction    Patient understands prevention, detection, and treatment of chronic complications.  Needs Instruction    Patient understands how to develop strategies to address psychosocial issues.  Needs Instruction    Patient understands how to develop strategies to promote health/change behavior.  Needs Instruction      Complications   Last HgB A1C per patient/outside source  8.9 %   05/23/2019   How often do you check your blood sugar?  3-4 times / week    Fasting Blood glucose range (mg/dL)  130-179   130-150   Postprandial Blood glucose range (mg/dL)  >200   235   Number of hypoglycemic episodes per month  0    Have you had a dilated eye exam in the past 12 months?  No    Have you had a dental exam in the past 12 months?  No    Are you checking your feet?  Yes    How many days per week are  you checking your feet?  3      Dietary Intake   Breakfast  rare English muffin, sausage, egg, cheese    Snack (morning)  none    Lunch  Cube steak, brown rice, mixed vegetables OR chicken or lamb chop, vegetables, brown rice OR steak and cheese sub on [employer]   11:00   Snack (afternoon)  none    Dinner  similar to lunch    Snack (evening)  occasional fruit    Beverage(s)  "a lot" of juice, G2 Gatorade, water      Exercise   Exercise Type  Light (walking / raking leaves)   just walking at work     Patient Education   Previous Diabetes Education  No    Disease state   Definition of diabetes, type 1 and 2, and the diagnosis of diabetes    Nutrition management   Role of diet in the treatment of diabetes and the relationship between the three main macronutrients and blood glucose level;Meal options for control of blood glucose level and chronic complications.;Carbohydrate counting    Physical activity and exercise   Role of exercise on diabetes management, blood pressure control and cardiac health.    Medications  Reviewed patients medication for diabetes, action, purpose, timing of dose and side effects.    Monitoring  Purpose and frequency of SMBG.;Identified appropriate SMBG and/or A1C goals.;Daily foot exams;Yearly dilated eye exam    Acute complications  Taught treatment of hypoglycemia - the 15 rule.    Chronic complications  Relationship between chronic complications and blood glucose control;Dental care;Reviewed with patient heart disease, higher risk of, and prevention;Assessed and discussed foot care and prevention of foot problems;Identified and discussed with patient  current chronic complications    Psychosocial adjustment  Worked with patient to identify barriers to care and solutions;Identified and addressed patients feelings and concerns about diabetes    Personal strategies to promote health  Lifestyle issues that need to be addressed for better diabetes care      Individualized  Goals (developed by patient)   Nutrition  General guidelines for healthy choices and portions discussed;Follow meal plan discussed    Physical Activity  Exercise 5-7 days per week;30 minutes per day    Medications  take my medication as prescribed    Monitoring   send in my blood glucose log as discussed    Reducing Risk  stop smoking    Health Coping  discuss diabetes with (comment)   RD, MD, CDCES     Post-Education Assessment   Patient understands the diabetes disease and treatment process.  Demonstrates understanding / competency    Patient understands incorporating nutritional management  into lifestyle.  Needs Review    Patient undertands incorporating physical activity into lifestyle.  Demonstrates understanding / competency    Patient understands using medications safely.  Demonstrates understanding / competency    Patient understands monitoring blood glucose, interpreting and using results  Demonstrates understanding / competency    Patient understands prevention, detection, and treatment of acute complications.  Demonstrates understanding / competency    Patient understands prevention, detection, and treatment of chronic complications.  Demonstrates understanding / competency    Patient understands how to develop strategies to address psychosocial issues.  Needs Review    Patient understands how to develop strategies to promote health/change behavior.  Needs Review      Outcomes   Expected Outcomes  Demonstrated interest in learning. Expect positive outcomes    Future DMSE  3-4 months    Program Status  Completed       Individualized Plan for Diabetes Self-Management Training:   Learning Objective:  Patient will have a greater understanding of diabetes self-management. Patient education plan is to attend individual and/or group sessions per assessed needs and concerns.   Plan:   Patient Instructions  Consider not smoking in the car.  Recommendation to quit. Stay active.   Aim for 30 minutes or more of activity most days. Rethink what you drink.  Drink water rather than beverages with carbohydrate.   Meat portion the size of a deck of cards. 1/2 your plate non starchy vegetables Lean meat, chicken without skin. Avoid added salt, processed meat, pickles, olives and other high sodium foods.  (fresh rather than canned). Avoid foods with phosphorous (phos...) or potassium chloride in the list of ingredients  Aim for 4 Carb Choices per meal (60 grams) +/- 1 either way  Aim for 0-1 Carbs per snack if hungry  Include protein in moderation with your meals and snacks Consider reading food labels for Total Carbohydrate of foods Continue checking BG at alternate times per day  Continue taking medication as directed by MD        Expected Outcomes:  Demonstrated interest in learning. Expect positive outcomes  Education material provided: ADA - How to Thrive: A Guide for Your Journey with Diabetes and Meal plan card, breakfast ideas  If problems or questions, patient to contact team via:  Phone  Future DSME appointment: 3-4 months

## 2018-12-18 NOTE — Patient Instructions (Addendum)
Consider not smoking in the car.  Recommendation to quit. Stay active.  Aim for 30 minutes or more of activity most days. Rethink what you drink.  Drink water rather than beverages with carbohydrate.   Meat portion the size of a deck of cards. 1/2 your plate non starchy vegetables Lean meat, chicken without skin. Avoid added salt, processed meat, pickles, olives and other high sodium foods.  (fresh rather than canned). Avoid foods with phosphorous (phos...) or potassium chloride in the list of ingredients  Aim for 4 Carb Choices per meal (60 grams) +/- 1 either way  Aim for 0-1 Carbs per snack if hungry  Include protein in moderation with your meals and snacks Consider reading food labels for Total Carbohydrate of foods Continue checking BG at alternate times per day  Continue taking medication as directed by MD

## 2018-12-29 ENCOUNTER — Emergency Department (HOSPITAL_COMMUNITY)
Admission: EM | Admit: 2018-12-29 | Discharge: 2018-12-29 | Disposition: A | Discharge status: Home / Self Care | Payer: BLUE CROSS/BLUE SHIELD | Attending: Emergency Medicine | Admitting: Emergency Medicine

## 2018-12-29 ENCOUNTER — Other Ambulatory Visit: Payer: Self-pay

## 2018-12-29 ENCOUNTER — Encounter (HOSPITAL_COMMUNITY): Payer: Self-pay | Admitting: *Deleted

## 2018-12-29 ENCOUNTER — Emergency Department (HOSPITAL_COMMUNITY): Payer: BLUE CROSS/BLUE SHIELD

## 2018-12-29 DIAGNOSIS — F17200 Nicotine dependence, unspecified, uncomplicated: Secondary | ICD-10-CM | POA: Diagnosis not present

## 2018-12-29 DIAGNOSIS — Y999 Unspecified external cause status: Secondary | ICD-10-CM | POA: Diagnosis not present

## 2018-12-29 DIAGNOSIS — Y929 Unspecified place or not applicable: Secondary | ICD-10-CM | POA: Diagnosis not present

## 2018-12-29 DIAGNOSIS — W502XXA Accidental twist by another person, initial encounter: Secondary | ICD-10-CM | POA: Diagnosis not present

## 2018-12-29 DIAGNOSIS — S99912A Unspecified injury of left ankle, initial encounter: Secondary | ICD-10-CM | POA: Diagnosis present

## 2018-12-29 DIAGNOSIS — N189 Chronic kidney disease, unspecified: Secondary | ICD-10-CM | POA: Insufficient documentation

## 2018-12-29 DIAGNOSIS — M25572 Pain in left ankle and joints of left foot: Secondary | ICD-10-CM | POA: Diagnosis not present

## 2018-12-29 DIAGNOSIS — S93402A Sprain of unspecified ligament of left ankle, initial encounter: Secondary | ICD-10-CM | POA: Insufficient documentation

## 2018-12-29 DIAGNOSIS — E1122 Type 2 diabetes mellitus with diabetic chronic kidney disease: Secondary | ICD-10-CM | POA: Insufficient documentation

## 2018-12-29 DIAGNOSIS — I129 Hypertensive chronic kidney disease with stage 1 through stage 4 chronic kidney disease, or unspecified chronic kidney disease: Secondary | ICD-10-CM | POA: Diagnosis not present

## 2018-12-29 DIAGNOSIS — Y9301 Activity, walking, marching and hiking: Secondary | ICD-10-CM | POA: Diagnosis not present

## 2018-12-29 DIAGNOSIS — S99812A Other specified injuries of left ankle, initial encounter: Secondary | ICD-10-CM | POA: Diagnosis not present

## 2018-12-29 MED ORDER — HYDROCODONE-ACETAMINOPHEN 5-325 MG PO TABS: 1.0000 | ORAL_TABLET | Freq: Four times a day (QID) | ORAL | 0 refills | Status: DC | PRN

## 2018-12-29 NOTE — ED Notes (Signed)
Pt's family member is picking pt up. Pt demonstrated proper use of crutches and ankle brace. Alert and ambulatory.

## 2018-12-29 NOTE — ED Provider Notes (Signed)
[facility] DEPT Provider Note   CSN: [id number] Arrival date & time: 12/29/18  0102    History   Chief Complaint Chief Complaint  Patient presents with  . Ankle Injury    left    HPI Bryan MCCLANAHAN is a 48 y.o. male.     Patient to ED with complaint of left ankle injury around 8:30 last night. He reports walking down a flight of steps and missing a step causing his left ankle to roll. He heard a popping sound and had onset of pain. When he got up tonight to go to the bathroom he found it difficult to bear weight, prompting ED visit. No numbness.   The history is provided by the patient. No language interpreter was used.  Ankle Injury     Past Medical History:  Diagnosis Date  . Chicken pox   . Chronic kidney disease   . Diabetes mellitus without complication ([place])   . Hyperlipidemia   . Hypertension     Patient Active Problem List   Diagnosis Date Noted  . Concern about STD in male without diagnosis 04/17/2017  . Essential hypertension 01/03/2017  . Type 2 diabetes mellitus ([place]) 08/21/2016    History reviewed. No pertinent surgical history.      Home Medications    Prior to Admission medications   Medication Sig Start Date End Date Taking? Authorizing Provider  amLODipine (NORVASC) 10 MG tablet Take 1 tablet (10 mg total) by mouth daily. 09/21/18   Lance Sell, NP  cloNIDine (CATAPRES) 0.1 MG tablet Take 1 tablet (0.1 mg total) by mouth daily. 09/23/18   Lance Sell, NP  furosemide (LASIX) 40 MG tablet Take 40 mg by mouth daily as needed for fluid.    [provider]  losartan (COZAAR) 100 MG tablet Take 1 tablet (100 mg total) by mouth daily. 09/21/18   Lance Sell, NP  metoprolol succinate (TOPROL-XL) 100 MG 24 hr tablet Take 1 tablet (100 mg total) by mouth 2 (two) times daily. 09/21/18   Lance Sell, NP  rosuvastatin (CRESTOR) 10 MG tablet Take 1 tablet (10 mg total) by mouth daily.  10/23/18   Elayne Snare, MD  Semaglutide (RYBELSUS) 7 MG TABS Take 1 tablet by mouth daily before breakfast. Take 30 minutes before breakfast with water 12/04/18   Elayne Snare, MD  sildenafil (VIAGRA) 100 MG tablet Take 50 mg by mouth daily as needed.    [provider]    Family History Family History  Problem Relation Age of Onset  . Hypertension Mother   . Hypertension Father   . Hypertension Maternal Grandmother   . Hypertension Maternal Grandfather   . Hypertension Paternal Grandmother   . Diabetes Paternal Grandmother   . Hypertension Paternal Grandfather     Social History Social History   Tobacco Use  . Smoking status: Current Every Day Smoker    Packs/day: 0.25    Years: 20.00    Pack years: 5.00  . Smokeless tobacco: Never Used  Substance Use Topics  . Alcohol use: No  . Drug use: No     Allergies   Patient has no known allergies.   Review of Systems Review of Systems  Constitutional: Negative for diaphoresis.  Musculoskeletal:       See HPI  Skin: Negative.  Negative for color change and wound.  Neurological: Negative.  Negative for numbness.     Physical Exam Updated Vital Signs BP (!) 150/93 (BP  Location: Right Arm)   Pulse 68   Temp 98.1 F (36.7 C) (Oral)   Resp 16   Ht 5\' 6"  (1.676 m)   Wt 92.5 kg   SpO2 100%   BMI 32.93 kg/m   Physical Exam Constitutional:      Appearance: He is well-developed.  Neck:     Musculoskeletal: Normal range of motion.  Pulmonary:     Effort: Pulmonary effort is normal.  Musculoskeletal: Normal range of motion.     Comments: Left ankle minimally swollen. Tender laterally. Joint stable. No calf or shin tenderness. Distal pulse present.  Skin:    General: Skin is warm and dry.  Neurological:     Mental Status: He is alert and oriented to person, place, and time.     Sensory: No sensory deficit.      ED Treatments / Results  Labs (all labs ordered are listed, but only abnormal results are  displayed) Labs Reviewed - No data to display  EKG None  Radiology Dg Ankle Complete Left  Result Date: 12/29/2018 CLINICAL DATA:  Twisting injury today with pain, initial encounter EXAM: LEFT ANKLE COMPLETE - 3+ VIEW COMPARISON:  None. FINDINGS: Calcaneal spurring is noted. Mild tarsal degenerative changes are seen. No acute fracture or dislocation is noted. No soft tissue abnormality is seen. IMPRESSION: Mild degenerative changes without acute abnormality. Electronically Signed   By: Inez Catalina M.D.   On: 12/29/2018 01:51    Procedures Procedures (including critical care time)  Medications Ordered in ED Medications - No data to display   Initial Impression / Assessment and Plan / ED Course  I have reviewed the triage vital signs and the nursing notes.  Pertinent labs & imaging results that were available during my care of the patient were reviewed by me and considered in my medical decision making (see chart for details).        Patient to ED after left ankle inversion injury last evening. No other injury.   Imaging is negative for fracture. There is no evidence of ligament rupture. Likely mild ankle sprain. ASO and crutches provided. The patient has a history of CKD - will avoid ibuprofen. #8 Norco provided for pain relief.   Final Clinical Impressions(s) / ED Diagnoses   Final diagnoses:  None   1. Left ankle sprain  ED Discharge Orders    None       Charlann Lange, Hershal Coria 85/63/14 [id number]    Delora Fuel, MD 63/78/58 [phone]

## 2018-12-29 NOTE — Discharge Instructions (Addendum)
Use crutches to be weight bearing as tolerated. You should be able to bear more weight over time. Ice and elevate to reduce swelling. Follow up with your doctor if you do not improve.

## 2018-12-29 NOTE — ED Notes (Signed)
Pt verbalized instructions on how to use crutches and use of ankle brace.

## 2018-12-29 NOTE — ED Triage Notes (Signed)
Pt stated "around 2000 I missed a step and heard something pop"  Pt c/o left ankle pain. +pp.  Minimal edema noted.

## 2019-01-15 ENCOUNTER — Other Ambulatory Visit: Payer: Self-pay | Admitting: *Deleted

## 2019-01-15 DIAGNOSIS — I10 Essential (primary) hypertension: Secondary | ICD-10-CM

## 2019-01-15 MED ORDER — LOSARTAN POTASSIUM 100 MG PO TABS: 100.0000 mg | ORAL_TABLET | Freq: Every day | ORAL | 0 refills | Status: DC

## 2019-01-26 DIAGNOSIS — I129 Hypertensive chronic kidney disease with stage 1 through stage 4 chronic kidney disease, or unspecified chronic kidney disease: Secondary | ICD-10-CM | POA: Diagnosis not present

## 2019-01-26 DIAGNOSIS — R809 Proteinuria, unspecified: Secondary | ICD-10-CM | POA: Diagnosis not present

## 2019-01-26 DIAGNOSIS — E1129 Type 2 diabetes mellitus with other diabetic kidney complication: Secondary | ICD-10-CM | POA: Diagnosis not present

## 2019-01-26 DIAGNOSIS — N183 Chronic kidney disease, stage 3 (moderate): Secondary | ICD-10-CM | POA: Diagnosis not present

## 2019-01-26 DIAGNOSIS — N179 Acute kidney failure, unspecified: Secondary | ICD-10-CM | POA: Diagnosis not present

## 2019-02-01 ENCOUNTER — Encounter: Payer: Self-pay | Admitting: Endocrinology

## 2019-02-01 ENCOUNTER — Other Ambulatory Visit: Payer: Self-pay

## 2019-02-01 ENCOUNTER — Other Ambulatory Visit (INDEPENDENT_AMBULATORY_CARE_PROVIDER_SITE_OTHER): Payer: BLUE CROSS/BLUE SHIELD

## 2019-02-01 DIAGNOSIS — E782 Mixed hyperlipidemia: Secondary | ICD-10-CM

## 2019-02-01 DIAGNOSIS — E1165 Type 2 diabetes mellitus with hyperglycemia: Secondary | ICD-10-CM

## 2019-02-01 LAB — GLUCOSE, RANDOM: Glucose, Bld: 118 mg/dL — ABNORMAL HIGH (ref 70–99)

## 2019-02-01 LAB — HEMOGLOBIN A1C: Hgb A1c MFr Bld: 7.7 % — ABNORMAL HIGH (ref 4.6–6.5)

## 2019-02-01 LAB — LIPID PANEL
Cholesterol: 176 mg/dL (ref 0–200)
HDL: 29.1 mg/dL — ABNORMAL LOW (ref >39.00)
LDL Cholesterol: 111 mg/dL — ABNORMAL HIGH (ref 0–99)
NonHDL: 146.64
Total CHOL/HDL Ratio: 6
Triglycerides: 180 mg/dL — ABNORMAL HIGH (ref 0.0–149.0)
VLDL: 36 mg/dL (ref 0.0–40.0)

## 2019-02-01 LAB — ALT: ALT: 56 U/L — ABNORMAL HIGH (ref 0–53)

## 2019-02-04 ENCOUNTER — Ambulatory Visit: Payer: BLUE CROSS/BLUE SHIELD | Admitting: Endocrinology

## 2019-02-09 DIAGNOSIS — E119 Type 2 diabetes mellitus without complications: Secondary | ICD-10-CM | POA: Diagnosis not present

## 2019-02-09 LAB — HM DIABETES EYE EXAM

## 2019-02-12 ENCOUNTER — Encounter: Payer: Self-pay | Admitting: Nurse Practitioner

## 2019-02-15 ENCOUNTER — Other Ambulatory Visit: Payer: Self-pay

## 2019-02-15 ENCOUNTER — Encounter: Payer: Self-pay | Admitting: Endocrinology

## 2019-02-15 ENCOUNTER — Ambulatory Visit (INDEPENDENT_AMBULATORY_CARE_PROVIDER_SITE_OTHER): Payer: BLUE CROSS/BLUE SHIELD | Admitting: Endocrinology

## 2019-02-15 DIAGNOSIS — E782 Mixed hyperlipidemia: Secondary | ICD-10-CM | POA: Diagnosis not present

## 2019-02-15 DIAGNOSIS — N289 Disorder of kidney and ureter, unspecified: Secondary | ICD-10-CM

## 2019-02-15 DIAGNOSIS — E1165 Type 2 diabetes mellitus with hyperglycemia: Secondary | ICD-10-CM | POA: Diagnosis not present

## 2019-02-15 NOTE — Progress Notes (Signed)
Patient ID: Bryan Webb, male   DOB: 08/26/1971, 48 y.o.   MRN: [id number]           Reason for Appointment: for Type 2 Diabetes  Referring MOQ:[id number], Delphia Grates, NP   Today's office visit was provided via telemedicine using video technique Explained to the patient and the the limitations of evaluation and management by telemedicine and the availability of in person appointments.  The patient understood the limitations and agreed to proceed. Patient also understood that the telehealth visit is billable. . Location of the patient: Home . Location of the provider: Office Only the patient and myself were participating in the encounter    History of Present Illness:          Date of diagnosis of type 2 diabetes mellitus: 07/2016       Background history:   He was initially diagnosed at an emergency room when he presented with high blood sugars He was initially given Lantus insulin but he was switched to Trulicity by his PCP He says he had better blood sugars with Trulicity and his K3T had gone down to 7.4 However not clear why he was put back on insulin after about 3 months and also Metformin He could not tolerate more than 1 tablet of 500 mg metformin ER  Recent history:   Most recent A1c 7.7, previously was 8.9  Non-insulin hypoglycemic drugs the patient is taking are: Rybelsus 7 mg daily  Current management, blood sugar patterns and problems identified:  He was able to go up to 7 mg Rybelsus since blood sugars previously were still high on 3 mg and he was gaining weight  Was having some nausea with the medication initially but now is used to it and is not complaining of nausea  He also thinks he has lost weight  However he wanted to check his blood sugars and has only one fasting reading of 120  Lab glucose in the afternoon was 118 and not clear if he had lunch at that time, also had a lab glucose of 117 with nephrologist He says he is trying to do more walking  He has seen the dietitian and is trying to change his diet including eating 3 meals a day now       Side effects from medications have been: None     Typical meal intake: Breakfast is variable, usually 3 meals a day            Exercise: walks some at home and also at work    Glucose monitoring:  done less than 1 times a day         Glucometer:   Contour       Blood Glucose readings as above   Dietician visit, most recent: 12/2018  Weight history: Maximum 240  Wt Readings from Last 3 Encounters:  12/29/18 204 lb (92.5 kg)  12/18/18 206 lb (93.4 kg)  12/04/18 205 lb 9.6 oz (93.3 kg)    Glycemic control:   Lab Results  Component Value Date   HGBA1C 7.7 (H) 02/01/2019   HGBA1C 8.9 (H) 09/21/2018   HGBA1C 9.1 (H) 04/17/2017   Lab Results  Component Value Date   MICROALBUR 12.9 (H) 12/02/2018   LDLCALC 111 (H) 02/01/2019   CREATININE 2.75 (H) 12/02/2018   Lab Results  Component Value Date   MICRALBCREAT 6.9 12/02/2018    Lab Results  Component Value Date   FRUCTOSAMINE 341 (H) 12/02/2018    Abstract on  02/12/2019  Component Date Value Ref Range Status  . HM Diabetic Eye Exam 02/09/2019 No Retinopathy  No Retinopathy Final    Allergies as of 02/15/2019   No Known Allergies     Medication List       Accurate as of February 15, 2019  3:56 PM. If you have any questions, ask your nurse or doctor.        STOP taking these medications   furosemide 40 MG tablet Commonly known as:  LASIX Stopped by:  Elayne Snare, MD   HYDROcodone-acetaminophen 5-325 MG tablet Commonly known as:  NORCO/VICODIN Stopped by:  Elayne Snare, MD     TAKE these medications   amLODipine 10 MG tablet Commonly known as:  NORVASC Take 1 tablet (10 mg total) by mouth daily.   cloNIDine 0.1 MG tablet Commonly known as:  CATAPRES Take 1 tablet (0.1 mg total) by mouth daily.   losartan 100 MG tablet Commonly known as:  COZAAR Take 1 tablet (100 mg total) by mouth daily. Need visit for  future refills.   metoprolol succinate 100 MG 24 hr tablet Commonly known as:  TOPROL-XL Take 1 tablet (100 mg total) by mouth 2 (two) times daily.   rosuvastatin 10 MG tablet Commonly known as:  Crestor Take 1 tablet (10 mg total) by mouth daily.   Semaglutide 7 MG Tabs Commonly known as:  Rybelsus Take 1 tablet by mouth daily before breakfast. Take 30 minutes before breakfast with water   sildenafil 100 MG tablet Commonly known as:  VIAGRA Take 50 mg by mouth daily as needed.       Allergies: No Known Allergies  Past Medical History:  Diagnosis Date  . Chicken pox   . Chronic kidney disease   . Diabetes mellitus without complication ([place])   . Hyperlipidemia   . Hypertension     History reviewed. No pertinent surgical history.  Family History  Problem Relation Age of Onset  . Hypertension Mother   . Hypertension Father   . Hypertension Maternal Grandmother   . Hypertension Maternal Grandfather   . Hypertension Paternal Grandmother   . Diabetes Paternal Grandmother   . Hypertension Paternal Grandfather     Social History:  reports that he has been smoking. He has a 5.00 pack-year smoking history. He has never used smokeless tobacco. He reports that he does not drink alcohol or use drugs.   Review of Systems   Lipid history: He was started on Crestor as he was not tolerating Lipitor He thinks he is taking this regularly although his LDL is still above 100 Also recently has lost some weight and is usually trying to cut back on high fat foods more recently Also has low HDL   Lab Results  Component Value Date   CHOL 176 02/01/2019   HDL 29.10 (L) 02/01/2019   LDLCALC 111 (H) 02/01/2019   LDLDIRECT 178.0 09/21/2018   TRIG 180.0 (H) 02/01/2019   CHOLHDL 6 02/01/2019           Hypertension: Has been treated with losartan 100 mg, clonidine 0.1, metoprolol 100 mg and amlodipine 10 mg daily On treatment since  ? 2010   Home BP is checked regularly, followed  by nephrologist  Nephrologist is considering changing losartan to hydralazine if creatinine goes up  BP Readings from Last 3 Encounters:  12/29/18 (!) 150/80  12/04/18 122/70  10/23/18 140/84    Most recent eye exam was in 11/18  Most recent foot exam: 10/2018  Currently known complications of diabetes: Erectile dysfunction  Chronic kidney disease: This is stable, recent creatinine from nephrologist is 2.69 and recently appears to be more stable   Lab Results  Component Value Date   CREATININE 2.75 (H) 12/02/2018   CREATININE 2.38 (H) 09/21/2018   CREATININE 1.98 (H) 09/02/2018     LABS:  Abstract on 02/12/2019  Component Date Value Ref Range Status  . HM Diabetic Eye Exam 02/09/2019 No Retinopathy  No Retinopathy Final    Physical Examination:  There were no vitals taken for this visit.       ASSESSMENT:  Diabetes type 2, and obesity  See history of present illness for detailed discussion of current diabetes management, blood sugar patterns and problems identified  His A1c is improving at 7.7  Current treatment regimen is Rybelsus 7 mg daily  He has been able to take this without side effect He has not done postprandial reading He reportedly has lost weight with improved diet and exercise and encouraged him to continue Discussed importance of checking blood sugars after meals which he is not doing  Hypercholesterolemia: He has significant hyperlipidemia not at target even with 10 mg of Crestor  HYPERTENSION: Blood pressure is reportedly still mildly increased at times  CHRONIC kidney disease: Follow-up with nephrologist as indicated   PLAN:    1. Glucose monitoring: . Patient reminded to check readings regularly with some readings fasting but more 2 hours after meals as he likely has high readings after meals . He will bring his monitor for download on the next visit  2.  Diabetes education: Patient will need follow-up instructions given by  dietitian and keep up his follow-up appointments   3.  Lifestyle changes: . Continue increasing exercise   4.  Medication changes needed: Will not increase his Rybelsus unless his A1c is persistently high Also check postprandial readings to establish consistent level of control Fructosamine will be done to correlate with his A1c   5.  Preventive care needed:  . Annual eye exam     HYPERTENSION: He will continue to follow-up with PCP and nephrologist  LIPIDS: Not controlled He will increase Crestor to 20 mg and explained that his LDL should be below 100  Discussion regarding issues brought up by the nephrologist:  There is no connection between taking Rybelsus and worsening of renal function, studies have not shown renal function deterioration with GLP-1 drugs including semaglutide. Since he has limited options for treatment and Rybelsus is working well he will continue this  Also regarding discussion about losartan, currently appears to have stable renal function since March and explained to the patient that he should be able to continue losartan for now.  Explained to him that losartan may potentially affect circulation in the kidneys but not otherwise have adverse reaction on the kidneys  Total visit time for evaluation and management of multiple problems and counseling =25 minutes  There are no Patient Instructions on file for this visit.    Elayne Snare 02/15/2019, 3:56 PM   Note: This office note was prepared with Dragon voice recognition system technology. Any transcriptional errors that result from this process are unintentional.

## 2019-02-16 MED ORDER — SEMAGLUTIDE 7 MG PO TABS: 1.0000 | ORAL_TABLET | Freq: Every day | ORAL | 3 refills | Status: DC

## 2019-02-16 MED ORDER — ROSUVASTATIN CALCIUM 20 MG PO TABS: 20.0000 mg | ORAL_TABLET | Freq: Every day | ORAL | 3 refills | Status: DC

## 2019-03-12 ENCOUNTER — Ambulatory Visit: Payer: BLUE CROSS/BLUE SHIELD | Admitting: Dietician

## 2019-03-29 ENCOUNTER — Other Ambulatory Visit: Payer: Self-pay | Admitting: Family

## 2019-03-29 DIAGNOSIS — I10 Essential (primary) hypertension: Secondary | ICD-10-CM

## 2019-03-29 MED ORDER — AMLODIPINE BESYLATE 10 MG PO TABS: 10.0000 mg | ORAL_TABLET | Freq: Every day | ORAL | 0 refills | Status: DC

## 2019-04-23 ENCOUNTER — Ambulatory Visit: Payer: BC Managed Care – PPO | Admitting: Dietician

## 2019-05-10 ENCOUNTER — Other Ambulatory Visit: Payer: Self-pay | Admitting: Internal Medicine

## 2019-05-10 ENCOUNTER — Other Ambulatory Visit: Payer: Self-pay | Admitting: Family

## 2019-05-10 DIAGNOSIS — I10 Essential (primary) hypertension: Secondary | ICD-10-CM

## 2019-05-10 NOTE — Telephone Encounter (Signed)
He must schedule OV to get further refills. Was due for OV in February 2020.

## 2019-05-11 NOTE — Telephone Encounter (Signed)
Called and left message for patient to return call to clinic schedule follow up appointment for further refills.

## 2019-05-12 DIAGNOSIS — I129 Hypertensive chronic kidney disease with stage 1 through stage 4 chronic kidney disease, or unspecified chronic kidney disease: Secondary | ICD-10-CM | POA: Diagnosis not present

## 2019-05-12 DIAGNOSIS — E1129 Type 2 diabetes mellitus with other diabetic kidney complication: Secondary | ICD-10-CM | POA: Diagnosis not present

## 2019-05-12 DIAGNOSIS — N179 Acute kidney failure, unspecified: Secondary | ICD-10-CM | POA: Diagnosis not present

## 2019-05-12 DIAGNOSIS — N183 Chronic kidney disease, stage 3 (moderate): Secondary | ICD-10-CM | POA: Diagnosis not present

## 2019-05-12 DIAGNOSIS — R809 Proteinuria, unspecified: Secondary | ICD-10-CM | POA: Diagnosis not present

## 2019-05-17 ENCOUNTER — Other Ambulatory Visit: Payer: BLUE CROSS/BLUE SHIELD

## 2019-05-18 ENCOUNTER — Other Ambulatory Visit (INDEPENDENT_AMBULATORY_CARE_PROVIDER_SITE_OTHER): Payer: BC Managed Care – PPO

## 2019-05-18 ENCOUNTER — Other Ambulatory Visit: Payer: Self-pay

## 2019-05-18 DIAGNOSIS — E1165 Type 2 diabetes mellitus with hyperglycemia: Secondary | ICD-10-CM

## 2019-05-18 DIAGNOSIS — E782 Mixed hyperlipidemia: Secondary | ICD-10-CM | POA: Diagnosis not present

## 2019-05-19 LAB — COMPREHENSIVE METABOLIC PANEL
ALT: 69 U/L — ABNORMAL HIGH (ref 0–53)
AST: 51 U/L — ABNORMAL HIGH (ref 0–37)
Albumin: 4.3 g/dL (ref 3.5–5.2)
Alkaline Phosphatase: 125 U/L — ABNORMAL HIGH (ref 39–117)
BUN: 43 mg/dL — ABNORMAL HIGH (ref 6–23)
CO2: 26 mEq/L (ref 19–32)
Calcium: 9.8 mg/dL (ref 8.4–10.5)
Chloride: 103 mEq/L (ref 96–112)
Creatinine, Ser: 2.33 mg/dL — ABNORMAL HIGH (ref 0.40–1.50)
GFR: 36.11 mL/min — ABNORMAL LOW (ref >60.00)
Glucose, Bld: 117 mg/dL — ABNORMAL HIGH (ref 70–99)
Potassium: 4.3 mEq/L (ref 3.5–5.1)
Sodium: 137 mEq/L (ref 135–145)
Total Bilirubin: 0.7 mg/dL (ref 0.2–1.2)
Total Protein: 8.1 g/dL (ref 6.0–8.3)

## 2019-05-19 LAB — LIPID PANEL
Cholesterol: 201 mg/dL — ABNORMAL HIGH (ref 0–200)
HDL: 35.4 mg/dL — ABNORMAL LOW (ref >39.00)
LDL Cholesterol: 131 mg/dL — ABNORMAL HIGH (ref 0–99)
NonHDL: 165.49
Total CHOL/HDL Ratio: 6
Triglycerides: 173 mg/dL — ABNORMAL HIGH (ref 0.0–149.0)
VLDL: 34.6 mg/dL (ref 0.0–40.0)

## 2019-05-19 LAB — HEMOGLOBIN A1C: Hgb A1c MFr Bld: 7.7 % — ABNORMAL HIGH (ref 4.6–6.5)

## 2019-05-19 LAB — FRUCTOSAMINE: Fructosamine: 299 umol/L — ABNORMAL HIGH (ref 0–285)

## 2019-05-20 ENCOUNTER — Other Ambulatory Visit: Payer: Self-pay

## 2019-05-20 ENCOUNTER — Ambulatory Visit: Payer: BC Managed Care – PPO | Admitting: Endocrinology

## 2019-05-20 ENCOUNTER — Encounter: Payer: Self-pay | Admitting: Endocrinology

## 2019-05-20 VITALS — BP 140/86 | HR 66 | Ht 66.0 in | Wt 197.4 lb

## 2019-05-20 DIAGNOSIS — E782 Mixed hyperlipidemia: Secondary | ICD-10-CM

## 2019-05-20 DIAGNOSIS — E1165 Type 2 diabetes mellitus with hyperglycemia: Secondary | ICD-10-CM | POA: Diagnosis not present

## 2019-05-20 NOTE — Patient Instructions (Addendum)
Check blood sugars on waking up 2-3 days a week  Also check blood sugars about 2 hours after meals and do this after different meals by rotation  Recommended blood sugar levels on waking up are 90-130 and about 2 hours after meal is 130-160  Please bring your blood sugar monitor to each visit, thank you  Take 2 of 7mg  Rybelsus then 14mg  every am  See Marrian Salvage, FNP

## 2019-05-20 NOTE — Progress Notes (Signed)
Patient ID: Bryan Webb, male   DOB: 1971/08/28, 48 y.o.   MRN: [id number]           Reason for Appointment: for Type 2 Diabetes    History of Present Illness:          Date of diagnosis of type 2 diabetes mellitus: 07/2016       Background history:   He was initially diagnosed at an emergency room when he presented with high blood sugars He was initially given Lantus insulin but he was switched to Trulicity by his PCP He says he had better blood sugars with Trulicity and his U7O had gone down to 7.4 However not clear why he was put back on insulin after about 3 months and also Metformin He could not tolerate more than 1 tablet of 500 mg metformin ER  Recent history:   Most recent A1c 7.7 as before  Non-insulin hypoglycemic drugs the patient is taking are: Rybelsus 7 mg daily  Current management, blood sugar patterns and problems identified:  He has been able to take his 7 mg Rybelsus without any side effect  He did run out off the medication for 3 days as his pharmacy did not have this in stock  As before he has not brought his blood sugar readings for review from his meter  He thinks he is checking his blood sugars mostly in the mornings and does not remember to check them after meals  Blood sugars are as below  Highest blood sugar apparently is 165  Also lab glucose was only 117 after lunch when he usually has a sandwich  Since starting Rybelsus he has been able to lose weight, previously had difficulty with weight gain  He thinks he is walking around quite a bit at work but not doing any formal exercise       Side effects from medications have been: None     Typical meal intake: Breakfast is variable, usually 3 meals a day            Exercise: walks at home and also at work    Glucose monitoring:  done less than 1 times a day         Glucometer:   Contour       Blood Glucose readings  Range 110-165, mostly done in the morning   Dietician visit, most  recent: 12/2018  Weight history: Maximum 240  Wt Readings from Last 3 Encounters:  05/20/19 197 lb 6.4 oz (89.5 kg)  12/29/18 204 lb (92.5 kg)  12/18/18 206 lb (93.4 kg)    Glycemic control:   Lab Results  Component Value Date   HGBA1C 7.7 (H) 05/18/2019   HGBA1C 7.7 (H) 02/01/2019   HGBA1C 8.9 (H) 09/21/2018   Lab Results  Component Value Date   MICROALBUR 12.9 (H) 12/02/2018   LDLCALC 131 (H) 05/18/2019   CREATININE 2.33 (H) 05/18/2019   Lab Results  Component Value Date   MICRALBCREAT 6.9 12/02/2018    Lab Results  Component Value Date   FRUCTOSAMINE 299 (H) 05/18/2019   FRUCTOSAMINE 341 (H) 12/02/2018    Lab on 05/18/2019  Component Date Value Ref Range Status  . Cholesterol 05/18/2019 201* 0 - 200 mg/dL Final   ATP III Classification       Desirable:  < 200 mg/dL               Borderline High:  200 - 239 mg/dL  High:  > = 240 mg/dL  . Triglycerides 05/18/2019 173.0* 0.0 - 149.0 mg/dL Final   Normal:  <150 mg/dLBorderline High:  150 - 199 mg/dL  . HDL 05/18/2019 35.40* >39.00 mg/dL Final  . VLDL 05/18/2019 34.6  0.0 - 40.0 mg/dL Final  . LDL Cholesterol 05/18/2019 131* 0 - 99 mg/dL Final  . Total CHOL/HDL Ratio 05/18/2019 6   Final                  Men          Women1/2 Average Risk     3.4          3.3Average Risk          5.0          4.42X Average Risk          9.6          7.13X Average Risk          15.0          11.0                      . NonHDL 05/18/2019 165.49   Final   NOTE:  Non-HDL goal should be 30 mg/dL higher than patient's LDL goal (i.e. LDL goal of < 70 mg/dL, would have non-HDL goal of < 100 mg/dL)  . Sodium 05/18/2019 137  135 - 145 mEq/L Final  . Potassium 05/18/2019 4.3  3.5 - 5.1 mEq/L Final  . Chloride 05/18/2019 103  96 - 112 mEq/L Final  . CO2 05/18/2019 26  19 - 32 mEq/L Final  . Glucose, Bld 05/18/2019 117* 70 - 99 mg/dL Final  . BUN 05/18/2019 43* 6 - 23 mg/dL Final  . Creatinine, Ser 05/18/2019 2.33* 0.40 - 1.50 mg/dL  Final  . Total Bilirubin 05/18/2019 0.7  0.2 - 1.2 mg/dL Final  . Alkaline Phosphatase 05/18/2019 125* 39 - 117 U/L Final  . AST 05/18/2019 51* 0 - 37 U/L Final  . ALT 05/18/2019 69* 0 - 53 U/L Final  . Total Protein 05/18/2019 8.1  6.0 - 8.3 g/dL Final  . Albumin 05/18/2019 4.3  3.5 - 5.2 g/dL Final  . Calcium 05/18/2019 9.8  8.4 - 10.5 mg/dL Final  . GFR 05/18/2019 36.11* >60.00 mL/min Final  . Hgb A1c MFr Bld 05/18/2019 7.7* 4.6 - 6.5 % Final   Glycemic Control Guidelines for People with Diabetes:Non Diabetic:  <6%Goal of Therapy: <7%Additional Action Suggested:  >8%   . Fructosamine 05/18/2019 299* 0 - 285 umol/L Final   Comment: Published reference interval for apparently healthy subjects between age 36 and 31 is 36 - 285 umol/L and in a poorly controlled diabetic population is 228 - 563 umol/L with a mean of 396 umol/L.     Allergies as of 05/20/2019   No Known Allergies     Medication List       Accurate as of May 20, 2019  3:30 PM. If you have any questions, ask your nurse or doctor.        STOP taking these medications   cloNIDine 0.1 MG tablet Commonly known as: CATAPRES Stopped by: Elayne Snare, MD     TAKE these medications   amLODipine 10 MG tablet Commonly known as: NORVASC Take 1 tablet (10 mg total) by mouth daily.   losartan 100 MG tablet Commonly known as: COZAAR Take 1 tablet (100 mg total) by mouth daily. Need visit for future refills.  metoprolol succinate 100 MG 24 hr tablet Commonly known as: TOPROL-XL Take 1 tablet (100 mg total) by mouth 2 (two) times daily.   rosuvastatin 20 MG tablet Commonly known as: Crestor Take 1 tablet (20 mg total) by mouth daily.   Semaglutide 7 MG Tabs Commonly known as: Rybelsus Take 1 tablet by mouth daily before breakfast. Take 30 minutes before breakfast with water   sildenafil 100 MG tablet Commonly known as: VIAGRA Take 50 mg by mouth daily as needed.       Allergies: No Known Allergies   Past Medical History:  Diagnosis Date  . Chicken pox   . Chronic kidney disease   . Diabetes mellitus without complication ([place])   . Hyperlipidemia   . Hypertension     History reviewed. No pertinent surgical history.  Family History  Problem Relation Age of Onset  . Hypertension Mother   . Hypertension Father   . Hypertension Maternal Grandmother   . Hypertension Maternal Grandfather   . Hypertension Paternal Grandmother   . Diabetes Paternal Grandmother   . Hypertension Paternal Grandfather     Social History:  reports that he has been smoking. He has a 5.00 pack-year smoking history. He has never used smokeless tobacco. He reports that he does not drink alcohol or use drugs.   Review of Systems   Lipid history: He was started on Crestor 20 mg as he was not tolerating Lipitor He thinks he is taking this regularly although his LDL is still above 100 Also recently has lost some weight and is usually trying to cut back on high fat foods Also has low HDL   Lab Results  Component Value Date   CHOL 201 (H) 05/18/2019   HDL 35.40 (L) 05/18/2019   LDLCALC 131 (H) 05/18/2019   LDLDIRECT 178.0 09/21/2018   TRIG 173.0 (H) 05/18/2019   CHOLHDL 6 05/18/2019           Hypertension: Has been treated with losartan 100 mg, clonidine 0.1, metoprolol 100 mg and amlodipine 10 mg daily On treatment since  ? 2010   Home BP is checked regularly, followed by nephrologist also    BP Readings from Last 3 Encounters:  05/20/19 140/86  12/29/18 (!) 150/80  12/04/18 122/70    Most recent eye exam was in 5/20  Most recent foot exam: 10/2018  Currently known complications of diabetes: Erectile dysfunction  Chronic kidney disease: This is improved, also seen by nephrologist   Lab Results  Component Value Date   CREATININE 2.33 (H) 05/18/2019   CREATININE 2.75 (H) 12/02/2018   CREATININE 2.38 (H) 09/21/2018     LABS:  Lab on 05/18/2019  Component Date Value Ref Range  Status  . Cholesterol 05/18/2019 201* 0 - 200 mg/dL Final   ATP III Classification       Desirable:  < 200 mg/dL               Borderline High:  200 - 239 mg/dL          High:  > = 240 mg/dL  . Triglycerides 05/18/2019 173.0* 0.0 - 149.0 mg/dL Final   Normal:  <150 mg/dLBorderline High:  150 - 199 mg/dL  . HDL 05/18/2019 35.40* >39.00 mg/dL Final  . VLDL 05/18/2019 34.6  0.0 - 40.0 mg/dL Final  . LDL Cholesterol 05/18/2019 131* 0 - 99 mg/dL Final  . Total CHOL/HDL Ratio 05/18/2019 6   Final  Men          Women1/2 Average Risk     3.4          3.3Average Risk          5.0          4.42X Average Risk          9.6          7.13X Average Risk          15.0          11.0                      . NonHDL 05/18/2019 165.49   Final   NOTE:  Non-HDL goal should be 30 mg/dL higher than patient's LDL goal (i.e. LDL goal of < 70 mg/dL, would have non-HDL goal of < 100 mg/dL)  . Sodium 05/18/2019 137  135 - 145 mEq/L Final  . Potassium 05/18/2019 4.3  3.5 - 5.1 mEq/L Final  . Chloride 05/18/2019 103  96 - 112 mEq/L Final  . CO2 05/18/2019 26  19 - 32 mEq/L Final  . Glucose, Bld 05/18/2019 117* 70 - 99 mg/dL Final  . BUN 05/18/2019 43* 6 - 23 mg/dL Final  . Creatinine, Ser 05/18/2019 2.33* 0.40 - 1.50 mg/dL Final  . Total Bilirubin 05/18/2019 0.7  0.2 - 1.2 mg/dL Final  . Alkaline Phosphatase 05/18/2019 125* 39 - 117 U/L Final  . AST 05/18/2019 51* 0 - 37 U/L Final  . ALT 05/18/2019 69* 0 - 53 U/L Final  . Total Protein 05/18/2019 8.1  6.0 - 8.3 g/dL Final  . Albumin 05/18/2019 4.3  3.5 - 5.2 g/dL Final  . Calcium 05/18/2019 9.8  8.4 - 10.5 mg/dL Final  . GFR 05/18/2019 36.11* >60.00 mL/min Final  . Hgb A1c MFr Bld 05/18/2019 7.7* 4.6 - 6.5 % Final   Glycemic Control Guidelines for People with Diabetes:Non Diabetic:  <6%Goal of Therapy: <7%Additional Action Suggested:  >8%   . Fructosamine 05/18/2019 299* 0 - 285 umol/L Final   Comment: Published reference interval for apparently healthy  subjects between age 18 and 24 is 41 - 285 umol/L and in a poorly controlled diabetic population is 228 - 563 umol/L with a mean of 396 umol/L.     Physical Examination:  BP 140/86 (BP Location: Left Arm, Patient Position: Sitting, Cuff Size: Normal)   Pulse 66   Ht 5\' 6"  (1.676 m)   Wt 197 lb 6.4 oz (89.5 kg)   SpO2 99%   BMI 31.86 kg/m        ASSESSMENT:  Diabetes type 2, and obesity  See history of present illness for detailed discussion of current diabetes management, blood sugar patterns and problems identified  His A1c is static at 7.7 However fructosamine of 299 indicates relatively modest hyperglycemia, previously was 341  Current treatment regimen is Rybelsus 7 mg daily monotherapy  He has been able to take this without nausea although he does notice a decrease in appetite from this Losing weight recently also Not clear if he has some high postprandial readings which he does not monitor He has seen the dietitian and is mostly following his diet  Hypercholesterolemia: He has significant hyperlipidemia not at target even with 10 mg of Crestor  HYPERTENSION: Blood pressure is reportedly still mildly increased at times  CHRONIC kidney disease: Follow-up with nephrologist as indicated   PLAN:    1. Glucose monitoring: . Need to check  his blood sugar after dinner more regularly and discussed postprandial targets . He will bring his monitor for download on the next visit . A1c target under 7 discussed  2.  Diabetes education: Patient will need follow-up instructions given by dietitian and keep up his follow-up appointments   3.  Lifestyle changes: . Continue efforts to improve diet and exercise regimen   4.  Medication changes needed: Rybelsus 14 mg daily for more consistent control   5.  Preventive care needed:  . Annual eye exam     HYPERTENSION: He will continue to follow-up with PCP and nephrologist  LIPIDS: Not controlled We will start him  on Zetia also, LDL target at least under 100    There are no Patient Instructions on file for this visit.   Total visit time for evaluation and management of multiple problems and counseling =25 minutes  Elayne Snare 05/20/2019, 3:30 PM   Note: This office note was prepared with Dragon voice recognition system technology. Any transcriptional errors that result from this process are unintentional.

## 2019-05-21 ENCOUNTER — Telehealth: Payer: Self-pay | Admitting: Endocrinology

## 2019-05-21 MED ORDER — EZETIMIBE 10 MG PO TABS: 10.0000 mg | ORAL_TABLET | Freq: Every day | ORAL | 3 refills | Status: DC

## 2019-05-21 NOTE — Telephone Encounter (Signed)
Forgot to tell him that his cholesterol is still too high even with the rosuvastatin.  Needs additional medication called ezetimibe to help bring cholesterol down adequately and this is being sent in.  He will take both together

## 2019-05-21 NOTE — Telephone Encounter (Signed)
Called pt and gave him MD message. Pt verbalized understanding and was agreeable to take the medication.

## 2019-05-28 ENCOUNTER — Encounter: Payer: Self-pay | Admitting: Family

## 2019-05-28 ENCOUNTER — Ambulatory Visit (INDEPENDENT_AMBULATORY_CARE_PROVIDER_SITE_OTHER): Payer: BC Managed Care – PPO | Admitting: Family

## 2019-05-28 ENCOUNTER — Other Ambulatory Visit: Payer: Self-pay

## 2019-05-28 VITALS — BP 140/84 | HR 59 | Temp 98.0°F | Ht 66.0 in | Wt 198.6 lb

## 2019-05-28 DIAGNOSIS — Z794 Long term (current) use of insulin: Secondary | ICD-10-CM

## 2019-05-28 DIAGNOSIS — E782 Mixed hyperlipidemia: Secondary | ICD-10-CM | POA: Diagnosis not present

## 2019-05-28 DIAGNOSIS — Z1211 Encounter for screening for malignant neoplasm of colon: Secondary | ICD-10-CM | POA: Diagnosis not present

## 2019-05-28 DIAGNOSIS — R0683 Snoring: Secondary | ICD-10-CM

## 2019-05-28 DIAGNOSIS — N184 Chronic kidney disease, stage 4 (severe): Secondary | ICD-10-CM

## 2019-05-28 DIAGNOSIS — I10 Essential (primary) hypertension: Secondary | ICD-10-CM | POA: Diagnosis not present

## 2019-05-28 DIAGNOSIS — E119 Type 2 diabetes mellitus without complications: Secondary | ICD-10-CM

## 2019-05-28 MED ORDER — LOSARTAN POTASSIUM 100 MG PO TABS: 100.0000 mg | ORAL_TABLET | Freq: Every day | ORAL | 2 refills | Status: DC

## 2019-05-28 MED ORDER — METOPROLOL SUCCINATE ER 100 MG PO TB24: 100.0000 mg | ORAL_TABLET | Freq: Every day | ORAL | 1 refills | Status: DC

## 2019-05-28 MED ORDER — AMLODIPINE BESYLATE 10 MG PO TABS: 10.0000 mg | ORAL_TABLET | Freq: Every day | ORAL | 2 refills | Status: DC

## 2019-05-28 NOTE — Progress Notes (Signed)
Bryan Webb is a 48 y.o. male with the following history as recorded in [employer]:  Patient Active Problem List   Diagnosis Date Noted  . Concern about STD in male without diagnosis 04/17/2017  . Essential hypertension 01/03/2017  . Type 2 diabetes mellitus ([place]) 08/21/2016    Current Outpatient Medications  Medication Sig Dispense Refill  . amLODipine (NORVASC) 10 MG tablet Take 1 tablet (10 mg total) by mouth daily. 90 tablet 2  . ezetimibe (ZETIA) 10 MG tablet Take 1 tablet (10 mg total) by mouth daily. 30 tablet 3  . losartan (COZAAR) 100 MG tablet Take 1 tablet (100 mg total) by mouth daily. 90 tablet 2  . metoprolol succinate (TOPROL-XL) 100 MG 24 hr tablet Take 1 tablet (100 mg total) by mouth daily. 90 tablet 1  . rosuvastatin (CRESTOR) 20 MG tablet Take 1 tablet (20 mg total) by mouth daily. 30 tablet 3  . Semaglutide (RYBELSUS) 7 MG TABS Take 1 tablet by mouth daily before breakfast. Take 30 minutes before breakfast with water 30 tablet 3  . sildenafil (VIAGRA) 100 MG tablet Take 50 mg by mouth daily as needed.     No current facility-administered medications for this visit.     Allergies: Patient has no known allergies.  Past Medical History:  Diagnosis Date  . Chicken pox   . Chronic kidney disease   . Diabetes mellitus without complication ([place])   . Hyperlipidemia   . Hypertension     History reviewed. No pertinent surgical history.  Family History  Problem Relation Age of Onset  . Hypertension Mother   . Hypertension Father   . Hypertension Maternal Grandmother   . Hypertension Maternal Grandfather   . Hypertension Paternal Grandmother   . Diabetes Paternal Grandmother   . Hypertension Paternal Grandfather     Social History   Tobacco Use  . Smoking status: Current Every Day Smoker    Packs/day: 0.25    Years: 20.00    Pack years: 5.00  . Smokeless tobacco: Never Used  Substance Use Topics  . 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  Head: Normocephalic and atraumatic  Eyes: Sclera and conjunctiva clear; pupils round and reactive to light; extraocular movements intact  Ears: External normal; canals clear; tympanic membranes normal  Oropharynx: Pink, supple. No suspicious lesions  Neck: Supple without thyromegaly, adenopathy  Lungs: Respirations unlabored; clear to auscultation bilaterally without wheeze, rales, rhonchi  CVS exam: normal rate and regular rhythm.  Abdomen: Soft; nontender; nondistended; normoactive bowel sounds; no masses or hepatosplenomegaly  Neurologic: Alert and oriented; speech intact; face symmetrical; moves all extremities well; CNII-XII intact without focal deficit   Assessment:  1. Essential hypertension   2. Snoring   3. Encounter for screening colonoscopy   4. Mixed hyperlipidemia   5. Type 2 diabetes mellitus without complication, with long-term current use of insulin ([place])   6. CKD (chronic kidney disease) stage 4, GFR 15-29 ml/min ([facility])     Plan:  1. Refill given on medications; encouraged to take all 3 medications as prescribed; follow-up in 3 months, sooner prn. 2. Refer for sleep study; 3. Refer for baseline colonoscopy 4. Continue medications/ managed by Dr. Dwyane Dee;  5. Control improving-  continue with endocrine; 6. Continue with nephrology;   Return in about 3 months (around 08/27/2019).  Orders Placed This Encounter  Procedures  . Ambulatory referral to Neurology    Referral Priority:   Routine    Referral Type:   Consultation    Referral Reason:   Specialty Services Required    Requested Specialty:   Neurology    Number of Visits Requested:   1  . Ambulatory referral to Gastroenterology    Referral Priority:   Routine    Referral Type:   Consultation    Referral Reason:   Specialty Services Required    Number of Visits Requested:   1    Requested Prescriptions   Signed Prescriptions Disp Refills  . amLODipine (NORVASC) 10 MG tablet 90 tablet 2    Sig: Take 1 tablet (10 mg total) by mouth daily.  Marland Kitchen losartan (COZAAR) 100 MG tablet 90 tablet 2    Sig: Take 1 tablet (100 mg total) by mouth daily.  . metoprolol succinate (TOPROL-XL) 100 MG 24 hr tablet 90 tablet 1    Sig: Take 1 tablet (100 mg total) by mouth daily.

## 2019-06-09 ENCOUNTER — Ambulatory Visit: Payer: BC Managed Care – PPO | Admitting: Neurology

## 2019-06-09 ENCOUNTER — Encounter: Payer: Self-pay | Admitting: Neurology

## 2019-06-09 ENCOUNTER — Other Ambulatory Visit: Payer: Self-pay

## 2019-06-09 VITALS — BP 131/75 | HR 63 | Temp 97.7°F | Ht 68.0 in | Wt 198.0 lb

## 2019-06-09 DIAGNOSIS — G478 Other sleep disorders: Secondary | ICD-10-CM

## 2019-06-09 DIAGNOSIS — N529 Male erectile dysfunction, unspecified: Secondary | ICD-10-CM

## 2019-06-09 DIAGNOSIS — Z794 Long term (current) use of insulin: Secondary | ICD-10-CM

## 2019-06-09 DIAGNOSIS — I10 Essential (primary) hypertension: Secondary | ICD-10-CM | POA: Diagnosis not present

## 2019-06-09 DIAGNOSIS — R0683 Snoring: Secondary | ICD-10-CM

## 2019-06-09 DIAGNOSIS — E119 Type 2 diabetes mellitus without complications: Secondary | ICD-10-CM

## 2019-06-09 DIAGNOSIS — M2619 Other specified anomalies of jaw-cranial base relationship: Secondary | ICD-10-CM

## 2019-06-09 NOTE — Patient Instructions (Signed)

## 2019-06-09 NOTE — Progress Notes (Signed)
SLEEP MEDICINE CLINIC    Provider:  Larey Seat, MD  Primary Care Physician:  Patient, No Pcp Per No address on file     Referring Provider: Marrian Salvage, [address] [place],  [place] [id number]          Chief Complaint according to patient   Patient presents with:    . New Patient (Initial Visit)     pt states that he has difficulty with getting enough sleep for several years and averages about 5 hrs a night of sleep, he states he does snore, probably for years.      HISTORY OF PRESENT ILLNESS:  Bryan Webb is a 48 y.o. year old  African American male patient seen here upon  a referral on 06/09/2019  Chief concern according to patient : " not getting enough sleep and snoring"    I have the pleasure of seeing Bryan Webb today, a right -handed Black or [place] American male with a possible sleep disorder. He   has a past medical history of Chicken pox, Chronic kidney disease, Diabetes mellitus without complication ([place]), Hyperlipidemia, and Hypertension..     Sleep relevant medical history: Nocturia only while DM was poorly controlled; no Sleep walking, no Night terrors. no Tonsillectomy, no cervical spine history of trauma or surgery/no ENT surgery.     Family medical /sleep history: No other family member with OSA, insomnia, no and sleep walkers.    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 and lives in a household with his mother and brother Family status is seperated, with 4 children, youngest is 45 years old The patient currently works night shifts on week ends. Pets are not  present. Tobacco use; 3 days he smokes 1 pack.  ETOH use : rarely,  Caffeine intake in form of Coffee( none) Soda( rarely) Tea ( seldomly) or energy drinks. Regular exercise in form of walking.   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    Sleep habits are as follows:   The patient's dinner time is between 7.30-8 PM. The patient goes to bed at 8.30 PM and continues to sleep for 4 hours, wakes for one bathroom breaks, and stays awake for 1-2 hours-   Bedroom is cool, quiet and dark with a fan in the background. The preferred sleep position is left side , with the support of 1 pillow.  He reports some knee pain and being some nights woken by that.  Dreams are reportedly rare.  5.30  AM is the usual rise time. TST is often less than 5 hours. He wakes at 5 AM - no alarm needed.  He reports not feeling refreshed or restored in AM, nut without  symptoms such as dry mouth, or morning headaches . Naps are taken infrequently.    Review of Systems: Out of a complete 14 system review, the patient complains of only the following symptoms, and all other reviewed systems are negative.:  Fatigue, sleepiness , snoring, fragmented sleep, INSOMNIA. Shift work, weekends working nights form midnight to 8 AM.    How likely are you to doze in the following situations: 0 = not likely, 1 = slight chance, 2 = moderate chance, 3 = high chance   Sitting and Reading?3 Watching Television? 2 Sitting inactive in a public place (theater or meeting)?2 As a passenger in a  car for an hour without a break? Lying down in the afternoon when circumstances permit?2 Sitting and talking to someone? Sitting quietly after lunch without alcohol?1 In a car, while stopped for a few minutes in traffic?   Total = 10/ 24 points   FSS endorsed at 38/ 63 points.    Social History   Socioeconomic History  . Marital status: Single    Spouse name: Not on file  . Number of children: 4  . Years of education: 17  . Highest education level: Not on file  Occupational History  . Occupation: Producer, television/film/video  . Occupation: Designer, fashion/clothing  . Financial resource strain: Not on file  . Food insecurity    Worry: Not on file    Inability: Not on file  . Transportation needs    Medical: Not  on file    Non-medical: Not on file  Tobacco Use  . Smoking status: Current Every Day Smoker    Packs/day: 0.25    Years: 20.00    Pack years: 5.00  . Smokeless tobacco: Never Used  Substance and Sexual Activity  . Alcohol use: No  . Drug use: No  . Sexual activity: Not on file  Lifestyle  . Physical activity    Days per week: Not on file    Minutes per session: Not on file  . Stress: Not on file  Relationships  . Social Herbalist on phone: Not on file    Gets together: Not on file    Attends religious service: Not on file    Active member of club or organization: Not on file    Attends meetings of clubs or organizations: Not on file    Relationship status: Not on file  Other Topics Concern  . Not on file  Social History Narrative   Fun: Always working, but likes to relax    Family History  Problem Relation Age of Onset  . Hypertension Mother   . Hypertension Father   . Hypertension Maternal Grandmother   . Hypertension Maternal Grandfather   . Hypertension Paternal Grandmother   . Diabetes Paternal Grandmother   . Hypertension Paternal Grandfather     Past Medical History:  Diagnosis Date  . Chicken pox   . Chronic kidney disease   . Diabetes mellitus without complication ([place])   . Hyperlipidemia   . Hypertension     No past surgical history on file.   Current Outpatient Medications on File Prior to Visit  Medication Sig Dispense Refill  . amLODipine (NORVASC) 10 MG tablet Take 1 tablet (10 mg total) by mouth daily. 90 tablet 2  . ezetimibe (ZETIA) 10 MG tablet Take 1 tablet (10 mg total) by mouth daily. 30 tablet 3  . losartan (COZAAR) 100 MG tablet Take 1 tablet (100 mg total) by mouth daily. 90 tablet 2  . metoprolol succinate (TOPROL-XL) 100 MG 24 hr tablet Take 1 tablet (100 mg total) by mouth daily. 90 tablet 1  . rosuvastatin (CRESTOR) 20 MG tablet Take 1 tablet (20 mg total) by mouth daily. 30 tablet 3  . Semaglutide 14 MG TABS Take 14  mg by mouth every morning.    . sildenafil (VIAGRA) 100 MG tablet Take 50 mg by mouth daily as needed.     No current facility-administered medications on file prior to visit.     No Known Allergies  Physical exam:  Today's Vitals   06/09/19 1301  BP: 131/75  Pulse: 63  Temp: 97.7 F (36.5 C)  Weight: 198 lb (89.8 kg)  Height: 5\' 8"  (1.727 m)   Body mass index is 30.11 kg/m.   Wt Readings from Last 3 Encounters:  06/09/19 198 lb (89.8 kg)  05/28/19 198 lb 9.6 oz (90.1 kg)  05/20/19 197 lb 6.4 oz (89.5 kg)     Ht Readings from Last 3 Encounters:  06/09/19 5\' 8"  (1.727 m)  05/28/19 5\' 6"  (1.676 m)  05/20/19 5\' 6"  (1.676 m)      General: The patient is awake, alert and appears not in acute distress. The patient is well groomed but appears nervous, restlessly tapping his left leg. . Head: Normocephalic, atraumatic. Neck is supple. Mallampati 4 neck circumference:16 inches . Nasal airflow patent.  Retrognathia is seen.  Dental status: poorly aligned.  Cardiovascular:  Regular rate and cardiac rhythm by radial pulse, without distended neck veins.no bruits.  Respiratory: Lungs are clear to auscultation.  Skin:  Without evidence of ankle edema, or rash. Trunk: The patient's posture is erect.   Neurologic exam : The patient is awake and alert, oriented to place and time.   Memory subjective described as intact.  Attention span & concentration ability appears normal.  Speech is fluent,  without  dysarthria, dysphonia or aphasia.  Mood and affect are appropriate.   Cranial nerves: no loss of smell or taste reported  Pupils are equal and briskly reactive to light. Funduscopic exam deferred..  Extraocular movements in vertical and horizontal planes were intact and without nystagmus.  No Diplopia. Visual fields by finger perimetry are intact. Hearing was intact to soft voice and finger rubbing.    Facial sensation intact to fine touch.  Facial motor strength is symmetric  and tongue and uvula move midline.  Neck ROM : rotation, tilt and flexion extension were normal for age and shoulder shrug was symmetrical.    Motor exam:  Symmetric bulk, tone and ROM.   Normal tone without cog wheeling, symmetric grip strength . He reports spasms in his leg muscles.    Sensory:  Fine touch, pinprick and vibration were normal.  Proprioception tested in the upper extremities was normal.   Coordination: Rapid alternating movements in the fingers/hands were of normal speed.  No changes in Penmanship.   Gait and station: Patient could rise unassisted from a seated position, walked without assistive device.  Stance is of normal width/ base and the patient turned with 3 steps.  Toe and heel walk were deferred.  Deep tendon reflexes: in the  upper and lower extremities are symmetric and intact.  Babinski response was deferred .        After spending a total time of  45 minutes face to face and additional time for physical and neurologic examination, review of laboratory studies,  personal review of imaging studies, reports and results of other testing and review of referral information / records as far as provided in visit, I have established the following assessments:  1) non restorative sleep and restricited sleep duration, early morning arousal at a time undesired 2) he may be a snorer,  but nobody mentioned apneas.  3) BMI is 30, but retrognathia, functional macroglossia and upper airway size are risk factors for OSA.    Bryan Webb is a 48 year old right-handed gentleman with a history of nonrestorative and non-refreshing sleep for several years now that he is attributed mainly to not getting the hours of sleep.  There may also be not enough  quality in the remaining sleep hours.  He has been able to suppress the desire to nap in daytime because it impairs his nocturnal sleep if he did so. In the meantime he has developed hypertension and diabetes mellitus, he  continues to smoke, Which may influence trends toward hypoxemia, sleep apnea and if sleep apnea is present it may also worsen the underlying conditions such as diabetes mellitus and hypertension.  We know that it is asleep before midnight that usually is important to control endocrine function such as insulin release.  I would suggest to  My Plan is to proceed with: by undergo a home sleep test or attended sleep study depending on his insurance coverage.  A home sleep test would be serving as a screening test if apnea is present or not, while an attended sleep study may also show me nocturnal irregular heartbeats,.  Of low oxygen saturation, restless legs and may explain why the patient wakes up so early in the morning and he does not desire treatment.  Until he has a sleep study I would like for him to start taking melatonin which is an over-the-counter supplement should be taken at 5 mg or less at bedtime.  Melatonin's ability to prolong sleep is more important for him then initiating sleep., keeping him at 5-6 hours of nocturnal sleep. He has not tried melatonin.    I would like to thank Marrian Salvage, [address] [place],  [place] [id number] for allowing me to meet with and to take care of this pleasant patient.   In short, Bryan Webb is presenting with DM. HTN and erectile dysfunction , and non restorative sleep a symptom that can be attributed to organic and non organic sleep disorders, he denies racing thoughts, anxiety, depression. .   Electronically signed by: Larey Seat, MD 06/09/2019 1:11 PM  [facility] [place] certified by The [employer] of Sleep Medicine and Diplomate of the [employer] of Sleep Medicine. Board certified In Neurology through the [place], Fellow of the [employer] of Neurology. Medical Director of [employer].

## 2019-06-14 ENCOUNTER — Ambulatory Visit (INDEPENDENT_AMBULATORY_CARE_PROVIDER_SITE_OTHER): Payer: BC Managed Care – PPO | Admitting: Neurology

## 2019-06-14 DIAGNOSIS — M2619 Other specified anomalies of jaw-cranial base relationship: Secondary | ICD-10-CM

## 2019-06-14 DIAGNOSIS — I10 Essential (primary) hypertension: Secondary | ICD-10-CM

## 2019-06-14 DIAGNOSIS — G478 Other sleep disorders: Secondary | ICD-10-CM

## 2019-06-14 DIAGNOSIS — G4731 Primary central sleep apnea: Secondary | ICD-10-CM

## 2019-06-14 DIAGNOSIS — E119 Type 2 diabetes mellitus without complications: Secondary | ICD-10-CM

## 2019-06-14 DIAGNOSIS — G4733 Obstructive sleep apnea (adult) (pediatric): Secondary | ICD-10-CM | POA: Diagnosis not present

## 2019-06-14 DIAGNOSIS — R0683 Snoring: Secondary | ICD-10-CM

## 2019-06-14 DIAGNOSIS — N529 Male erectile dysfunction, unspecified: Secondary | ICD-10-CM

## 2019-06-28 ENCOUNTER — Encounter: Payer: Self-pay | Admitting: Family

## 2019-06-28 DIAGNOSIS — R0683 Snoring: Secondary | ICD-10-CM | POA: Insufficient documentation

## 2019-06-28 DIAGNOSIS — G478 Other sleep disorders: Secondary | ICD-10-CM | POA: Insufficient documentation

## 2019-06-28 DIAGNOSIS — N529 Male erectile dysfunction, unspecified: Secondary | ICD-10-CM | POA: Insufficient documentation

## 2019-06-28 DIAGNOSIS — M2619 Other specified anomalies of jaw-cranial base relationship: Secondary | ICD-10-CM | POA: Insufficient documentation

## 2019-06-28 NOTE — Addendum Note (Signed)
Addended by: Larey Seat on: 06/28/2019 01:02 PM   Modules accepted: Orders

## 2019-06-28 NOTE — Procedures (Signed)
PATIENT'S NAME:  Bryan Webb, Bryan Webb DOB:      02-27-1971      MRN:    [id number]     DATE OF RECORDING: 06/14/2019  [facility] REFERRING M.D.:  Marrian Salvage FNP  Study Performed:   Baseline Polysomnogram with expanded EEG montage HISTORY:  Bryan Webb is a 48 y.o. year old African American male patient seen upon referral on 06/09/2019.  Chief concern according to patient: "I am not getting enough sleep and I am snoring".  I have the pleasure of seeing Bryan Webb today, a right -handed Black or African American male with a medical history of Chickenpox, Chronic kidney disease, Diabetes mellitus without complication ([place]), Hyperlipidemia, and Hypertension. Sleep relevant medical history: Nocturia only while DM was poorly controlled; shift work sleep disorder.  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 Family status is separated, with 4 children, his youngest is 52 years old. The patient currently works night shifts on weekends.  Tobacco use; over 3 days he smokes1 pack.     The patient endorsed the Epworth Sleepiness Scale at 10/24 points and the FSS at   Points. .   The patient's weight 198 pounds with a height of 68 (inches), resulting in a BMI of 30.1 kg/m2. The patient's neck circumference measured 16 inches.  CURRENT MEDICATIONS: Norvasc, Zetia, Cozaar, Toprol-XL, Crestor, Viagra, Semaglutide.   PROCEDURE:  This is a multichannel digital polysomnogram utilizing the Somnostar 11.2 system.  Electrodes and sensors were applied and monitored per AASM Specifications.   EEG, EOG, Chin and Limb EMG, were sampled at 200 Hz.  ECG, Snore and Nasal Pressure, Thermal Airflow, Respiratory Effort, CPAP Flow and Pressure, Oximetry was sampled at 50 Hz. Digital video and audio were recorded.      BASELINE STUDY: Lights Out was at 21:54 and Lights On at 05:02.   Total recording time (TRT) was 428 minutes, with a total sleep time (TST) of 364.5 minutes.   The patient's sleep latency was 35 minutes.  REM latency was 60.5 minutes.  The sleep efficiency was 85.2 %.     SLEEP ARCHITECTURE: WASO (Wake after sleep onset) was 35 minutes.  There were 4.5 minutes in Stage N1, 250 minutes Stage N2, 48.5 minutes Stage N3 and 61.5 minutes in Stage REM.  The percentage of Stage N1 was 1.2%, Stage N2 was 68.6%, Stage N3 was 13.3% and Stage R (REM sleep) was 16.9%.  RESPIRATORY ANALYSIS:  There were a total of 101 respiratory events:  6 obstructive apneas, 8 central apneas and 0 mixed apneas with 87 hypopneas. The patient also had many respiratory event related arousals (RERAs).   The total APNEA/HYPOPNEA INDEX (AHI) was 16.6 /hour.  73 events occurred in REM sleep and 44 events in NREM.  The REM AHI was 71.2 /hour, versus a non-REM AHI of 5.5. The patient spent 147 minutes of total sleep time in the supine position and 218 minutes in non-supine. The supine AHI was 24.5 /h versus a non-supine AHI of 11.4/h.  OXYGEN SATURATION & C02:  The Wake baseline 02 saturation was 94%, with the lowest being 84%. TOTAL SLEEP Time spent below 89% saturation equaled 23 minutes.  AROUSALS:  The arousals were noted as: 91 were spontaneous, 0 were associated with PLMs, 48 were associated with respiratory events. The patient had a total of  0 Periodic Limb Movements.    Audio and video analysis did not show any abnormal or unusual movements, complex behaviors, phonations or vocalizations.  Nocturia times 1. Moderate Snoring was noted. EKG in normal sinus rhythm (NSR).  IMPRESSION:  1. Complex sleep apnea with OSA and CSA, but due to hypopneas mostly obstructive sleep apnea. AHI was exacerbated in REM sleep to 71.2/h. Supine AHI was 24.5/h.  2. Intermittent hypoxemia of sleep - related to apnea, mostly clustered during REM sleep.    RECOMMENDATIONS:  1. Advise full-night, attended, CPAP  titration study to optimize therapy.  2. PLAN B-  If this is not approved by insurance, please send an order for Auto-CPAP from 6 to16 cm water pressure, 2 cm EPR , heated humidity and mask of patients comfort and choice.    I certify that I have reviewed the entire raw data recording prior to the issuance of this report in accordance with the Standards of Accreditation of the American Academy of Sleep Medicine (AASM)      Larey Seat, MD    06-28-2019 Diplomat, American Board of Psychiatry and Neurology  Diplomat, American Board of [place] Director, [place] [facility] at [employer]

## 2019-06-29 ENCOUNTER — Telehealth: Payer: Self-pay | Admitting: Neurology

## 2019-06-29 NOTE — Telephone Encounter (Signed)
Called patient to discuss sleep study results. No answer at this time. LVM for the patient to call back.   

## 2019-06-29 NOTE — Telephone Encounter (Signed)
-----   Message from Larey Seat, MD sent at 06/28/2019  1:02 PM EDT ----- IMPRESSION:  1. Complex sleep apnea with OSA and CSA, but due to hypopneas mostly obstructive sleep apnea. AHI was exacerbated in REM sleep to 71.2/h. Supine AHI was 24.5/h.  2. Intermittent hypoxemia of sleep - related to apnea, mostly clustered during REM sleep.  3. Normal EEG.   RECOMMENDATIONS:  1. Advise full-night, attended, CPAP titration study to optimize therapy.  2. PLAN B-  If this is not approved by insurance, please send an order for Auto-CPAP from 6 to16 cm water pressure, 2 cm EPR , heated humidity and mask of patients comfort and choice. Dental device treatment option did not apply due to REM dependent apnea     I certify that I have reviewed the entire raw data recording prior to the issuance of this report in accordance with the Standards of Accreditation of the [place] of Sleep Medicine (AASM)

## 2019-07-01 NOTE — Telephone Encounter (Signed)
Called the patient to review the results with him. Pt answered and states he will call back around 1 pm

## 2019-07-15 NOTE — Telephone Encounter (Signed)
Called patient to discuss sleep study results. No answer at this time. LVM for the patient to call back.   

## 2019-07-22 ENCOUNTER — Encounter: Payer: Self-pay | Admitting: Neurology

## 2019-07-30 ENCOUNTER — Other Ambulatory Visit (HOSPITAL_COMMUNITY)
Admission: RE | Admit: 2019-07-30 | Discharge: 2019-07-30 | Disposition: A | Discharge status: Home / Self Care | Payer: BC Managed Care – PPO | Source: Ambulatory Visit | Attending: Neurology | Admitting: Neurology

## 2019-07-30 DIAGNOSIS — Z01812 Encounter for preprocedural laboratory examination: Secondary | ICD-10-CM | POA: Diagnosis not present

## 2019-07-30 DIAGNOSIS — Z20828 Contact with and (suspected) exposure to other viral communicable diseases: Secondary | ICD-10-CM | POA: Insufficient documentation

## 2019-07-31 LAB — SARS CORONAVIRUS 2 (TAT 6-24 HRS): SARS Coronavirus 2: NEGATIVE

## 2019-08-02 ENCOUNTER — Ambulatory Visit (INDEPENDENT_AMBULATORY_CARE_PROVIDER_SITE_OTHER): Payer: BC Managed Care – PPO | Admitting: Neurology

## 2019-08-02 DIAGNOSIS — Z794 Long term (current) use of insulin: Secondary | ICD-10-CM

## 2019-08-02 DIAGNOSIS — G4731 Primary central sleep apnea: Secondary | ICD-10-CM

## 2019-08-02 DIAGNOSIS — I10 Essential (primary) hypertension: Secondary | ICD-10-CM

## 2019-08-02 DIAGNOSIS — G478 Other sleep disorders: Secondary | ICD-10-CM

## 2019-08-02 DIAGNOSIS — R0683 Snoring: Secondary | ICD-10-CM

## 2019-08-02 DIAGNOSIS — M2619 Other specified anomalies of jaw-cranial base relationship: Secondary | ICD-10-CM

## 2019-08-02 DIAGNOSIS — E119 Type 2 diabetes mellitus without complications: Secondary | ICD-10-CM

## 2019-08-04 NOTE — Addendum Note (Signed)
Addended by: Larey Seat on: 08/04/2019 05:31 PM   Modules accepted: Orders

## 2019-08-04 NOTE — Procedures (Signed)
PATIENT'S NAME:  Bryan Webb, Bryan Webb DOB:      30-Oct-1970      MR#:    [id number]  [facility]   DATE OF RECORDING: 08/02/2019 REFERRING M.D.:  Marrian Salvage, FNP Study Performed:  Titration to Positive Airway Pressure Mr. Pettry returns after his previous expanded EEG polysomnography sleep study from 06-14-2019 documented : Complex sleep apnea with OSA and CSA, but due to hypopneas mostly obstructive sleep apnea. AHI was exacerbated in REM sleep to 71.2/h. Supine AHI was 24.5/h. Intermittent hypoxemia of sleep related to apnea, mostly clustered during REM sleep.  Patient had an AHI of 16.6, REM AHI of 71.2, supine AHI of 24.5 and an oxygen nadir of 84%. The patient endorsed the Epworth Sleepiness Scale at 10/24 points.    The patient's weight 198 pounds with a height of 68 (inches), resulting in a BMI of 30.1 kg/m2.  The patient's neck circumference measured 16 inches.  CURRENT MEDICATIONS: Norvasc, Zetia, Cozaar, Toprol-XL, Crestor, Viagra, Semaglutide.   PROCEDURE:  This is a multichannel digital polysomnogram utilizing the SomnoStar 11.2 system.  Electrodes and sensors were applied and monitored per AASM Specifications.   EEG, EOG, Chin and Limb EMG, were sampled at 200 Hz.  ECG, Snore and Nasal Pressure, Thermal Airflow, Respiratory Effort, CPAP Flow and Pressure, Oximetry was sampled at 50 Hz. Digital video and audio were recorded.      CPAP was initiated at 5 cmH20 with heated humidity per AASM split night standards and pressure was advanced to 12 cmH20 because of hypopneas, apneas and desaturations.  At a PAP pressure of 12 cmH20, there was a reduction of the AHI to 1.3/h with improvement of sleep apnea. [employer] Simplus  in medium.    Lights Out was at 22:13 and Lights On at 04:55. Total recording time (TRT) was 403 minutes, with a total sleep time (TST) of 380 minutes. The patient's sleep latency was 12.5 minutes. REM latency was 75.5 minutes.  The sleep efficiency was 94.3 %.     SLEEP ARCHITECTURE: WASO (Wake after sleep onset)  was 10.5 minutes.  There were 7.5 minutes in Stage N1, 233 minutes Stage N2, 58.5 minutes Stage N3 and 81 minutes in Stage REM.  The percentage of Stage N1 was 2.%, Stage N2 was 61.3%, Stage N3 was 15.4% and Stage R (REM sleep) was 21.3%.   RESPIRATORY ANALYSIS:  There was a total of 23 respiratory events: 2 obstructive apneas, 13 central apneas and 0 mixed apneas with a total of 15 apneas and an apnea index (AI) of 2.4 /hour. There were 8 hypopneas with a hypopnea index of 1.3/hour.    The total APNEA/HYPOPNEA INDEX  (AHI) was 3.6 /hour. 4 events occurred in REM sleep and 19 events in NREM. The REM AHI was 3. /hour versus a non-REM AHI of 3.8 /hour.  The patient spent 113 minutes of total sleep time in the supine position and 267 minutes in non-supine. The supine AHI was 5.4, versus a non-supine AHI of 2.9.  OXYGEN SATURATION & C02:  The baseline 02 saturation was 96%, with the lowest being 91%. Time spent below 89% saturation equaled 0 minutes. The arousals were noted as: 86 were spontaneous, 0 were associated with PLMs, 7 were associated with respiratory events. The patient had a total of 0 Periodic Limb Movements.   Audio and video analysis did not show any abnormal or unusual movements, behaviors, phonations or vocalizations. The patient took bathroom breaks.  The patient was fitted with  a F&P Simplus Medium Full-face mask.  DIAGNOSIS 1. Obstructive and Central Sleep Apnea, responding to CPAP at 12 cm water, with a total sleep time of 45 minutes, 95% nadir, under a medium FFM Simplus.    PLANS/RECOMMENDATIONS:  Starting therapy on an auto-titrator CPAP with heated humidity and Simplus medium mask. Pressure settings between 8 and 15 cm water with 2 cm EPR .   RV after 60-90 days of use.   A follow up appointment will be scheduled in the Sleep Clinic at [facility].   Please call [phone] with any questions.       I certify that I have reviewed the entire raw data recording prior to the issuance of this report in accordance with the Standards of Accreditation of the American Academy of Sleep Medicine (AASM)    Larey Seat, M.D.  08-04-2019 Diplomat, American Board of Psychiatry and Neurology  Diplomat, [place] of Sleep Medicine Medical Director, [place] [facility] at [facility]

## 2019-08-05 ENCOUNTER — Telehealth: Payer: Self-pay | Admitting: Neurology

## 2019-08-05 NOTE — Telephone Encounter (Signed)
-----   Message from Larey Seat, MD sent at 08/04/2019  5:31 PM EST ----- DIAGNOSIS  1. Obstructive and Central Sleep Apnea, responding to CPAP at 12  cm water, with a total sleep time of 45 minutes, 95% nadir, under  a medium FFM Simplus.    PLANS/RECOMMENDATIONS: Starting therapy on an auto-titrator CPAP  with heated humidity and Simplus medium mask. Pressure settings  between 8 and 15 cm water with 2 cm EPR .    RV after 60-90 days of use.   A follow up appointment will be scheduled in the Sleep Clinic at  [facility].  Please call [phone] with  any questions.

## 2019-08-05 NOTE — Telephone Encounter (Signed)
I called pt. I advised pt that Dr. Brett Fairy reviewed their sleep study results and found that pt has sleep apnea and was treated with CPAP. Dr. Brett Fairy recommends that pt starts auto CPAP. I reviewed PAP compliance expectations with the pt. Pt is agreeable to starting a CPAP. I advised pt that an order will be sent to a DME, [facility], and [facility] will call the pt within about one week after they file with the pt's insurance. [facility] will show the pt how to use the machine, fit for masks, and troubleshoot the CPAP if needed. A follow up appt was made for insurance purposes with Ward Givens, NP on Jan 28,2021 at 11:30 am. Pt verbalized understanding to arrive 15 minutes early and bring their CPAP. A letter with all of this information in it will be mailed to the pt as a reminder. I verified with the pt that the address we have on file is correct. Pt verbalized understanding of results. Pt had no questions at this time but was encouraged to call back if questions arise. I have sent the order to [facility] and have received confirmation that they have received the order.

## 2019-08-16 DIAGNOSIS — I129 Hypertensive chronic kidney disease with stage 1 through stage 4 chronic kidney disease, or unspecified chronic kidney disease: Secondary | ICD-10-CM | POA: Diagnosis not present

## 2019-08-16 DIAGNOSIS — N179 Acute kidney failure, unspecified: Secondary | ICD-10-CM | POA: Diagnosis not present

## 2019-08-16 DIAGNOSIS — E1129 Type 2 diabetes mellitus with other diabetic kidney complication: Secondary | ICD-10-CM | POA: Diagnosis not present

## 2019-08-16 DIAGNOSIS — R809 Proteinuria, unspecified: Secondary | ICD-10-CM | POA: Diagnosis not present

## 2019-08-16 DIAGNOSIS — N183 Chronic kidney disease, stage 3 unspecified: Secondary | ICD-10-CM | POA: Diagnosis not present

## 2019-08-20 DIAGNOSIS — G4733 Obstructive sleep apnea (adult) (pediatric): Secondary | ICD-10-CM | POA: Diagnosis not present

## 2019-08-23 ENCOUNTER — Other Ambulatory Visit (INDEPENDENT_AMBULATORY_CARE_PROVIDER_SITE_OTHER): Payer: BC Managed Care – PPO

## 2019-08-23 ENCOUNTER — Other Ambulatory Visit: Payer: Self-pay

## 2019-08-23 DIAGNOSIS — E1165 Type 2 diabetes mellitus with hyperglycemia: Secondary | ICD-10-CM | POA: Diagnosis not present

## 2019-08-23 DIAGNOSIS — E782 Mixed hyperlipidemia: Secondary | ICD-10-CM | POA: Diagnosis not present

## 2019-08-24 LAB — LIPID PANEL
Cholesterol: 232 mg/dL — ABNORMAL HIGH (ref 0–200)
HDL: 35.6 mg/dL — ABNORMAL LOW (ref >39.00)
NonHDL: 196.74
Total CHOL/HDL Ratio: 7
Triglycerides: 263 mg/dL — ABNORMAL HIGH (ref 0.0–149.0)
VLDL: 52.6 mg/dL — ABNORMAL HIGH (ref 0.0–40.0)

## 2019-08-24 LAB — COMPREHENSIVE METABOLIC PANEL
ALT: 57 U/L — ABNORMAL HIGH (ref 0–53)
AST: 42 U/L — ABNORMAL HIGH (ref 0–37)
Albumin: 4.3 g/dL (ref 3.5–5.2)
Alkaline Phosphatase: 142 U/L — ABNORMAL HIGH (ref 39–117)
BUN: 34 mg/dL — ABNORMAL HIGH (ref 6–23)
CO2: 26 mEq/L (ref 19–32)
Calcium: 9.7 mg/dL (ref 8.4–10.5)
Chloride: 102 mEq/L (ref 96–112)
Creatinine, Ser: 2.49 mg/dL — ABNORMAL HIGH (ref 0.40–1.50)
GFR: 33.41 mL/min — ABNORMAL LOW (ref >60.00)
Glucose, Bld: 140 mg/dL — ABNORMAL HIGH (ref 70–99)
Potassium: 4.1 mEq/L (ref 3.5–5.1)
Sodium: 136 mEq/L (ref 135–145)
Total Bilirubin: 0.6 mg/dL (ref 0.2–1.2)
Total Protein: 8 g/dL (ref 6.0–8.3)

## 2019-08-24 LAB — LDL CHOLESTEROL, DIRECT: Direct LDL: 158 mg/dL

## 2019-08-24 LAB — GLUCOSE, RANDOM: Glucose, Bld: 140 mg/dL — ABNORMAL HIGH (ref 70–99)

## 2019-08-24 LAB — HEMOGLOBIN A1C: Hgb A1c MFr Bld: 7.7 % — ABNORMAL HIGH (ref 4.6–6.5)

## 2019-08-26 ENCOUNTER — Encounter: Payer: Self-pay | Admitting: Endocrinology

## 2019-08-26 ENCOUNTER — Ambulatory Visit (INDEPENDENT_AMBULATORY_CARE_PROVIDER_SITE_OTHER): Payer: BC Managed Care – PPO | Admitting: Endocrinology

## 2019-08-26 VITALS — BP 142/80 | HR 65 | Ht 68.0 in | Wt 203.0 lb

## 2019-08-26 DIAGNOSIS — Z794 Long term (current) use of insulin: Secondary | ICD-10-CM | POA: Diagnosis not present

## 2019-08-26 DIAGNOSIS — E1165 Type 2 diabetes mellitus with hyperglycemia: Secondary | ICD-10-CM

## 2019-08-26 DIAGNOSIS — E782 Mixed hyperlipidemia: Secondary | ICD-10-CM

## 2019-08-26 NOTE — Patient Instructions (Addendum)
Check blood sugars on waking up 3-7 days a week  Also check blood sugars about 2 hours after meals and do this after different meals by rotation  Recommended blood sugar levels on waking up are 90-130 and about 2 hours after meal is 130-160  Please bring your blood sugar monitor to each visit, thank you  Take Crestor daily

## 2019-08-26 NOTE — Progress Notes (Signed)
Patient ID: Bryan Webb, male   DOB: [birth year], 48 y.o.   MRN: [id number]           Reason for Appointment: for Type 2 Diabetes    History of Present Illness:          Date of diagnosis of type 2 diabetes mellitus: 07/2016       Background history:   He was initially diagnosed at an emergency room when he presented with high blood sugars He was initially given Lantus insulin but he was switched to Trulicity by his PCP He says he had better blood sugars with Trulicity and his H8E had gone down to 7.4 However not clear why he was put back on insulin after about 3 months and also Metformin He could not tolerate more than 1 tablet of 500 mg metformin ER  Recent history:   Most recent A1c 7.7 as before  Non-insulin hypoglycemic drugs the patient is taking are: Rybelsus 7 mg daily  Current management, blood sugar patterns and problems identified:  He has not been able to increase the Rybelsus to 14 mg because of nausea  He does not like injections, previously had done Trulicity injection  As before he is having difficulties with consistent supply of the medication at his [place] and only recently started back after a week or 2 interaction of his treatment  He does take his Rybelsus consistently in the morning before breakfast  He thinks he is getting a lot of walking at work and does not do any exercises on his own  His weight has gone up 5 pounds, previously had started losing weight but likely has gone off her diet a lot better over the holiday       Side effects from medications have been: None     Typical meal intake: Breakfast is variable, usually 3 meals a day            Exercise: walks at home and also at work    Glucose monitoring:  done less than 1 times a day         Glucometer:   Contour       Blood Glucose readings  Range 115-145, mostly checked in the morning, did not bring his meter for download   Dietician visit, most recent: 12/2018  Weight  history: Maximum 240  Wt Readings from Last 3 Encounters:  08/26/19 203 lb (92.1 kg)  06/09/19 198 lb (89.8 kg)  05/28/19 198 lb 9.6 oz (90.1 kg)    Glycemic control:   Lab Results  Component Value Date   HGBA1C 7.7 (H) 08/23/2019   HGBA1C 7.7 (H) 05/18/2019   HGBA1C 7.7 (H) 02/01/2019   Lab Results  Component Value Date   MICROALBUR 12.9 (H) 12/02/2018   LDLCALC 131 (H) 05/18/2019   CREATININE 2.49 (H) 08/23/2019   Lab Results  Component Value Date   MICRALBCREAT 6.9 12/02/2018    Lab Results  Component Value Date   FRUCTOSAMINE 299 (H) 05/18/2019   FRUCTOSAMINE 341 (H) 12/02/2018    Lab on 08/23/2019  Component Date Value Ref Range Status  . Sodium 08/23/2019 136  135 - 145 mEq/L Final  . Potassium 08/23/2019 4.1  3.5 - 5.1 mEq/L Final  . Chloride 08/23/2019 102  96 - 112 mEq/L Final  . CO2 08/23/2019 26  19 - 32 mEq/L Final  . Glucose, Bld 08/23/2019 140* 70 - 99 mg/dL Final  . BUN 08/23/2019 34* 6 - 23 mg/dL Final  .  Creatinine, Ser 08/23/2019 2.49* 0.40 - 1.50 mg/dL Final  . Total Bilirubin 08/23/2019 0.6  0.2 - 1.2 mg/dL Final  . Alkaline Phosphatase 08/23/2019 142* 39 - 117 U/L Final  . AST 08/23/2019 42* 0 - 37 U/L Final  . ALT 08/23/2019 57* 0 - 53 U/L Final  . Total Protein 08/23/2019 8.0  6.0 - 8.3 g/dL Final  . Albumin 08/23/2019 4.3  3.5 - 5.2 g/dL Final  . GFR 08/23/2019 33.41* >60.00 mL/min Final  . Calcium 08/23/2019 9.7  8.4 - 10.5 mg/dL Final  . Cholesterol 08/23/2019 232* 0 - 200 mg/dL Final   ATP III Classification       Desirable:  < 200 mg/dL               Borderline High:  200 - 239 mg/dL          High:  > = 240 mg/dL  . Triglycerides 08/23/2019 263.0* 0.0 - 149.0 mg/dL Final   Normal:  <150 mg/dLBorderline High:  150 - 199 mg/dL  . HDL 08/23/2019 35.60* >39.00 mg/dL Final  . VLDL 08/23/2019 52.6* 0.0 - 40.0 mg/dL Final  . Total CHOL/HDL Ratio 08/23/2019 7   Final                  Men          Women1/2 Average Risk     3.4           3.3Average Risk          5.0          4.42X Average Risk          9.6          7.13X Average Risk          15.0          11.0                      . NonHDL 08/23/2019 196.74   Final   NOTE:  Non-HDL goal should be 30 mg/dL higher than patient's LDL goal (i.e. LDL goal of < 70 mg/dL, would have non-HDL goal of < 100 mg/dL)  . Glucose, Bld 08/23/2019 140* 70 - 99 mg/dL Final  . Hgb A1c MFr Bld 08/23/2019 7.7* 4.6 - 6.5 % Final   Glycemic Control Guidelines for People with Diabetes:Non Diabetic:  <6%Goal of Therapy: <7%Additional Action Suggested:  >8%   . Direct LDL 08/23/2019 158.0  mg/dL Final   Optimal:  <100 mg/dLNear or Above Optimal:  100-129 mg/dLBorderline High:  130-159 mg/dLHigh:  160-189 mg/dLVery High:  >190 mg/dL    Allergies as of 08/26/2019   No Known Allergies     Medication List       Accurate as of August 26, 2019  3:48 PM. If you have any questions, ask your nurse or doctor.        amLODipine 10 MG tablet Commonly known as: NORVASC Take 1 tablet (10 mg total) by mouth daily.   ezetimibe 10 MG tablet Commonly known as: Zetia Take 1 tablet (10 mg total) by mouth daily.   losartan 100 MG tablet Commonly known as: COZAAR Take 1 tablet (100 mg total) by mouth daily.   metoprolol succinate 100 MG 24 hr tablet Commonly known as: TOPROL-XL Take 1 tablet (100 mg total) by mouth daily.   rosuvastatin 20 MG tablet Commonly known as: Crestor Take 1 tablet (20 mg total) by mouth daily.   Semaglutide  14 MG Tabs Take 14 mg by mouth every morning.   sildenafil 100 MG tablet Commonly known as: VIAGRA Take 50 mg by mouth daily as needed.       Allergies: No Known Allergies  Past Medical History:  Diagnosis Date  . Chicken pox   . Chronic kidney disease   . Diabetes mellitus without complication ([place])   . Hyperlipidemia   . Hypertension     History reviewed. No pertinent surgical history.  Family History  Problem Relation Age of Onset  . Hypertension  Mother   . Hypertension Father   . Hypertension Maternal Grandmother   . Hypertension Maternal Grandfather   . Hypertension Paternal Grandmother   . Diabetes Paternal Grandmother   . Hypertension Paternal Grandfather     Social History:  reports that he has been smoking. He has a 5.00 pack-year smoking history. He has never used smokeless tobacco. He reports that he does not drink alcohol or use drugs.   Review of Systems   Lipid history: He was started on Crestor 20 mg as he was not tolerating Lipitor  Although he had previously done better on his diet he is not doing consistently for the last couple of weeks He was told to start Zetia in addition because of LDL of 131 but he misunderstood and he stopped the Crestor instead  Also has low HDL   Lab Results  Component Value Date   CHOL 232 (H) 08/23/2019   CHOL 201 (H) 05/18/2019   CHOL 176 02/01/2019   Lab Results  Component Value Date   HDL 35.60 (L) 08/23/2019   HDL 35.40 (L) 05/18/2019   HDL 29.10 (L) 02/01/2019   Lab Results  Component Value Date   LDLCALC 131 (H) 05/18/2019   LDLCALC 111 (H) 02/01/2019   LDLCALC 160 (H) 12/02/2018   Lab Results  Component Value Date   TRIG 263.0 (H) 08/23/2019   TRIG 173.0 (H) 05/18/2019   TRIG 180.0 (H) 02/01/2019   Lab Results  Component Value Date   CHOLHDL 7 08/23/2019   CHOLHDL 6 05/18/2019   CHOLHDL 6 02/01/2019   Lab Results  Component Value Date   LDLDIRECT 158.0 08/23/2019   LDLDIRECT 178.0 09/21/2018            Hypertension: Has been treated with losartan 100 mg, clonidine 0.1, metoprolol 100 mg and amlodipine 10 mg daily On treatment since  ? 2010   Home BP is checked regularly, followed by nephrologist also    BP Readings from Last 3 Encounters:  08/26/19 (!) 142/80  06/09/19 131/75  05/28/19 140/84    Most recent eye exam was in 5/20  Most recent foot exam: 10/2018  Currently known complications of diabetes: Erectile dysfunction  Chronic  kidney disease: Recently stable, also seen by nephrologist   Lab Results  Component Value Date   CREATININE 2.49 (H) 08/23/2019   CREATININE 2.33 (H) 05/18/2019   CREATININE 2.75 (H) 12/02/2018   Reportedly has fatty liver  Lab Results  Component Value Date   ALT 57 (H) 08/23/2019     LABS:  Lab on 08/23/2019  Component Date Value Ref Range Status  . Sodium 08/23/2019 136  135 - 145 mEq/L Final  . Potassium 08/23/2019 4.1  3.5 - 5.1 mEq/L Final  . Chloride 08/23/2019 102  96 - 112 mEq/L Final  . CO2 08/23/2019 26  19 - 32 mEq/L Final  . Glucose, Bld 08/23/2019 140* 70 - 99 mg/dL Final  . BUN  08/23/2019 34* 6 - 23 mg/dL Final  . Creatinine, Ser 08/23/2019 2.49* 0.40 - 1.50 mg/dL Final  . Total Bilirubin 08/23/2019 0.6  0.2 - 1.2 mg/dL Final  . Alkaline Phosphatase 08/23/2019 142* 39 - 117 U/L Final  . AST 08/23/2019 42* 0 - 37 U/L Final  . ALT 08/23/2019 57* 0 - 53 U/L Final  . Total Protein 08/23/2019 8.0  6.0 - 8.3 g/dL Final  . Albumin 08/23/2019 4.3  3.5 - 5.2 g/dL Final  . GFR 08/23/2019 33.41* >60.00 mL/min Final  . Calcium 08/23/2019 9.7  8.4 - 10.5 mg/dL Final  . Cholesterol 08/23/2019 232* 0 - 200 mg/dL Final   ATP III Classification       Desirable:  < 200 mg/dL               Borderline High:  200 - 239 mg/dL          High:  > = 240 mg/dL  . Triglycerides 08/23/2019 263.0* 0.0 - 149.0 mg/dL Final   Normal:  <150 mg/dLBorderline High:  150 - 199 mg/dL  . HDL 08/23/2019 35.60* >39.00 mg/dL Final  . VLDL 08/23/2019 52.6* 0.0 - 40.0 mg/dL Final  . Total CHOL/HDL Ratio 08/23/2019 7   Final                  Men          Women1/2 Average Risk     3.4          3.3Average Risk          5.0          4.42X Average Risk          9.6          7.13X Average Risk          15.0          11.0                      . NonHDL 08/23/2019 196.74   Final   NOTE:  Non-HDL goal should be 30 mg/dL higher than patient's LDL goal (i.e. LDL goal of < 70 mg/dL, would have non-HDL goal of < 100  mg/dL)  . Glucose, Bld 08/23/2019 140* 70 - 99 mg/dL Final  . Hgb A1c MFr Bld 08/23/2019 7.7* 4.6 - 6.5 % Final   Glycemic Control Guidelines for People with Diabetes:Non Diabetic:  <6%Goal of Therapy: <7%Additional Action Suggested:  >8%   . Direct LDL 08/23/2019 158.0  mg/dL Final   Optimal:  <100 mg/dLNear or Above Optimal:  100-129 mg/dLBorderline High:  130-159 mg/dLHigh:  160-189 mg/dLVery High:  >190 mg/dL    Physical Examination:  BP (!) 142/80 (BP Location: Left Arm, Patient Position: Sitting, Cuff Size: Normal)   Pulse 65   Ht 5\' 8"  (1.727 m)   Wt 203 lb (92.1 kg)   SpO2 99%   BMI 30.87 kg/m        ASSESSMENT:  Diabetes type 2, and obesity  See history of present illness for detailed discussion of current diabetes management, blood sugar patterns and problems identified  His A1c is static at 7.7 However fructosamine previously of 299 indicates  A1c may be higher than expected for his level of blood sugars  Current treatment regimen is Rybelsus 7 mg daily monotherapy Recently has not watched his diet and is likely needing to do better with this consistently Also not clear if he has high readings after  meals, glucose in the afternoon on the lab was only 140 but no readings after dinner available  He has been not been able to take 40 mg Rybelsus  Hypercholesterolemia: He has significant hyperlipidemia As above he is not taking his Crestor by mistake and LDL has gone up as expected  HYPERTENSION: Blood pressure is relatively good  PLAN:    For now continue Rybelsus alone 7 mg daily However will need to check his fructosamine to correlate with his A1c on the next visit Discussed the need to check readings after meals and if he is finding them to be over 180 will consider adding low-dose Amaryl in the evening Consistent diet    HYPERTENSION: He will need to follow-up regularly with PCP and nephrologist  LIPIDS: Not controlled and he is not taking his Crestor by  mistake, only taking the Zetia that was started last time Discussed that he needs to take both medications together and will recheck lipids on the next visit      Patient Instructions  Check blood sugars on waking up 3-7 days a week  Also check blood sugars about 2 hours after meals and do this after different meals by rotation  Recommended blood sugar levels on waking up are 90-130 and about 2 hours after meal is 130-160  Please bring your blood sugar monitor to each visit, thank you      This note is not being shared with the patient for the following reason: To respect privacy (The patient or proxy has requested that the information not be shared).    Elayne Snare 08/26/2019, 3:48 PM   Note: This office note was prepared with Dragon voice recognition system technology. Any transcriptional errors that result from this process are unintentional.

## 2019-08-27 ENCOUNTER — Ambulatory Visit: Payer: BC Managed Care – PPO | Admitting: Family

## 2019-09-06 ENCOUNTER — Other Ambulatory Visit: Payer: Self-pay

## 2019-09-06 ENCOUNTER — Encounter: Payer: Self-pay | Admitting: Family

## 2019-09-06 ENCOUNTER — Ambulatory Visit (INDEPENDENT_AMBULATORY_CARE_PROVIDER_SITE_OTHER): Payer: BC Managed Care – PPO | Admitting: Family

## 2019-09-06 VITALS — BP 138/84 | HR 61 | Temp 98.4°F | Ht 68.0 in | Wt 201.2 lb

## 2019-09-06 DIAGNOSIS — E119 Type 2 diabetes mellitus without complications: Secondary | ICD-10-CM

## 2019-09-06 DIAGNOSIS — Z794 Long term (current) use of insulin: Secondary | ICD-10-CM

## 2019-09-06 DIAGNOSIS — E782 Mixed hyperlipidemia: Secondary | ICD-10-CM

## 2019-09-06 DIAGNOSIS — N184 Chronic kidney disease, stage 4 (severe): Secondary | ICD-10-CM

## 2019-09-06 DIAGNOSIS — I10 Essential (primary) hypertension: Secondary | ICD-10-CM | POA: Diagnosis not present

## 2019-09-06 DIAGNOSIS — R0683 Snoring: Secondary | ICD-10-CM

## 2019-09-06 DIAGNOSIS — Z1211 Encounter for screening for malignant neoplasm of colon: Secondary | ICD-10-CM | POA: Diagnosis not present

## 2019-09-06 MED ORDER — METOPROLOL SUCCINATE ER 100 MG PO TB24: 100.0000 mg | ORAL_TABLET | Freq: Every day | ORAL | 3 refills | Status: DC

## 2019-09-06 MED ORDER — AMLODIPINE BESYLATE 10 MG PO TABS: 10.0000 mg | ORAL_TABLET | Freq: Every day | ORAL | 3 refills | Status: DC

## 2019-09-06 MED ORDER — LOSARTAN POTASSIUM 100 MG PO TABS: 100.0000 mg | ORAL_TABLET | Freq: Every day | ORAL | 3 refills | Status: DC

## 2019-09-06 NOTE — Progress Notes (Signed)
Bryan Webb is a 48 y.o. male with the following history as recorded in [employer]:  Patient Active Problem List   Diagnosis Date Noted  . Retrognathia 06/28/2019  . Non-restorative sleep 06/28/2019  . Snoring 06/28/2019  . Vasculogenic erectile dysfunction 06/28/2019  . Concern about STD in male without diagnosis 04/17/2017  . Essential hypertension 01/03/2017  . Type 2 diabetes mellitus ([place]) 08/21/2016    Current Outpatient Medications  Medication Sig Dispense Refill  . amLODipine (NORVASC) 10 MG tablet Take 1 tablet (10 mg total) by mouth daily. 90 tablet 3  . ezetimibe (ZETIA) 10 MG tablet Take 1 tablet (10 mg total) by mouth daily. 30 tablet 3  . losartan (COZAAR) 100 MG tablet Take 1 tablet (100 mg total) by mouth daily. 90 tablet 3  . metoprolol succinate (TOPROL-XL) 100 MG 24 hr tablet Take 1 tablet (100 mg total) by mouth daily. 90 tablet 3  . rosuvastatin (CRESTOR) 20 MG tablet Take 1 tablet (20 mg total) by mouth daily. 30 tablet 3  . Semaglutide 14 MG TABS Take 14 mg by mouth every morning.    . sildenafil (VIAGRA) 100 MG tablet Take 50 mg by mouth daily as needed.     No current facility-administered medications for this visit.    Allergies: Patient has no known allergies.  Past Medical History:  Diagnosis Date  . Chicken pox   . Chronic kidney disease   . Diabetes mellitus without complication ([place])   . Hyperlipidemia   . Hypertension     No past surgical history on file.  Family History  Problem Relation Age of Onset  . Hypertension Mother   . Hypertension Father   . Hypertension Maternal Grandmother   . Hypertension Maternal Grandfather   . Hypertension Paternal Grandmother   . Diabetes Paternal Grandmother   . Hypertension Paternal Grandfather     Social History   Tobacco Use  . Smoking status: Current Every Day Smoker    Packs/day: 0.25    Years: 20.00    Pack years: 5.00  . Smokeless tobacco: Never Used  . Tobacco comment: considering quitting  completely in the next year  Substance Use Topics  . Alcohol use: No    Subjective:  3 month follow-up on hypertension; is taking medications as prescribed; Denies any chest pain, shortness of breath, blurred vision or headache Continuing to see his endocrinologist every 3-4 months- most recent Hgba1c stable at 7.7; no concerns for low blood sugar; Is trying to cut back on cigarettes- down to 4-5 / day; not ready to quit completely yet;   Objective:  Vitals:   09/06/19 1541  BP: 138/84  Pulse: 61  Temp: 98.4 F (36.9 C)  TempSrc: Oral  SpO2: 99%  Weight: 201 lb 3.2 oz (91.3 kg)  Height: 5\' 8"  (1.727 m)    General: Well developed, well nourished, in no acute distress  Skin : Warm and dry.  Head: Normocephalic and atraumatic  Lungs: Respirations unlabored; clear to auscultation bilaterally without wheeze, rales, rhonchi  CVS exam: normal rate and regular rhythm.  Neurologic: Alert and oriented; speech intact; face symmetrical; moves all extremities well; CNII-XII intact without focal deficit   Assessment:  1. Essential hypertension   2. Encounter for screening colonoscopy   3. Type 2 diabetes mellitus without complication, with long-term current use of insulin ([place])   4. Mixed hyperlipidemia   5. CKD (chronic kidney disease) stage 4, GFR 15-29 ml/min ([facility])   6. Snoring  Plan:  1. Stable; continue same medications; 2. Referral updated for baseline colonoscopy; 3. Continue with endocrine as scheduled; 4. Continue Crestor and Zetia as prescribed; encouraged to continue working on diet/ exercise/ weight loss. 5. Under care of nephrology; 6. He needs to contact his neurologist to let them know about difficulty adjusting to CPAP- changes may need to be made prior to upcoming appointment.   Encouraged to quit smoking completely; Follow-up in 1 year, sooner prn.    Return in about 1 year (around 09/05/2020).  Orders Placed This Encounter  Procedures  . Ambulatory referral  to Gastroenterology    Referral Priority:   Routine    Referral Type:   Consultation    Referral Reason:   Specialty Services Required    Number of Visits Requested:   1    Requested Prescriptions   Signed Prescriptions Disp Refills  . amLODipine (NORVASC) 10 MG tablet 90 tablet 3    Sig: Take 1 tablet (10 mg total) by mouth daily.  Marland Kitchen losartan (COZAAR) 100 MG tablet 90 tablet 3    Sig: Take 1 tablet (100 mg total) by mouth daily.  . metoprolol succinate (TOPROL-XL) 100 MG 24 hr tablet 90 tablet 3    Sig: Take 1 tablet (100 mg total) by mouth daily.

## 2019-09-20 DIAGNOSIS — G4733 Obstructive sleep apnea (adult) (pediatric): Secondary | ICD-10-CM | POA: Diagnosis not present

## 2019-10-01 ENCOUNTER — Telehealth: Payer: Self-pay

## 2019-10-01 NOTE — Telephone Encounter (Signed)
PA initiated and approved via [email or web address] for Rybelsus 7mg  tablets.  RIC [place] (Key: [id number]) Rybelsus 7MG  tablets   Form Blue Building control surveyor Form (CB) Created 3 minutes ago Sent to Plan 2 minutes ago Plan Response 2 minutes ago Submit Clinical Questions 1 minute ago Determination Favorable 1 minute ago Message from Plan Effective from 10/01/2019 through 09/29/2020.  Bryan Webb (Key: [id number])  This request has received a Favorable outcome from [place].  Please keep in mind this is not a guarantee of payment. Eligibility and Benefit determinations will be made at the time of service.  Please note any additional information provided by [facility] at the bottom of the screen.

## 2019-10-14 ENCOUNTER — Ambulatory Visit: Payer: Self-pay | Admitting: Adult Health

## 2019-10-14 ENCOUNTER — Encounter: Payer: Self-pay | Admitting: Adult Health

## 2019-10-21 ENCOUNTER — Encounter: Payer: Self-pay | Admitting: Family

## 2019-10-21 DIAGNOSIS — G4733 Obstructive sleep apnea (adult) (pediatric): Secondary | ICD-10-CM | POA: Diagnosis not present

## 2019-11-01 ENCOUNTER — Telehealth: Payer: Self-pay | Admitting: Endocrinology

## 2019-11-01 NOTE — Telephone Encounter (Signed)
Called pt to clarify his previous message. 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 Pt was advised of the paperwork turnaround time policy. He verbalized understanding of this and he stated that he would bring the paperwork to this office.

## 2019-11-01 NOTE — Telephone Encounter (Signed)
Patient called to request to have his Labs, A1C and name of drug Rybeslus faxed to a Physician (did not know his name) Fax# [phone]

## 2019-11-05 DIAGNOSIS — Z0279 Encounter for issue of other medical certificate: Secondary | ICD-10-CM

## 2019-11-05 NOTE — Telephone Encounter (Signed)
Called pt and informed him that the paperwork he requested be completed has been done and is ready for pick up. Pt requested that paperwork be faxed. This has been done and original copy will be picked up by pt this afternoon.

## 2019-11-23 ENCOUNTER — Other Ambulatory Visit (INDEPENDENT_AMBULATORY_CARE_PROVIDER_SITE_OTHER): Payer: BC Managed Care – PPO

## 2019-11-23 ENCOUNTER — Other Ambulatory Visit: Payer: Self-pay

## 2019-11-23 DIAGNOSIS — E1165 Type 2 diabetes mellitus with hyperglycemia: Secondary | ICD-10-CM | POA: Diagnosis not present

## 2019-11-23 DIAGNOSIS — E782 Mixed hyperlipidemia: Secondary | ICD-10-CM | POA: Diagnosis not present

## 2019-11-23 DIAGNOSIS — Z794 Long term (current) use of insulin: Secondary | ICD-10-CM

## 2019-11-24 LAB — COMPREHENSIVE METABOLIC PANEL
ALT: 49 U/L (ref 0–53)
AST: 45 U/L — ABNORMAL HIGH (ref 0–37)
Albumin: 4.3 g/dL (ref 3.5–5.2)
Alkaline Phosphatase: 121 U/L — ABNORMAL HIGH (ref 39–117)
BUN: 32 mg/dL — ABNORMAL HIGH (ref 6–23)
CO2: 27 mEq/L (ref 19–32)
Calcium: 9.6 mg/dL (ref 8.4–10.5)
Chloride: 103 mEq/L (ref 96–112)
Creatinine, Ser: 2.31 mg/dL — ABNORMAL HIGH (ref 0.40–1.50)
GFR: 36.39 mL/min — ABNORMAL LOW (ref >60.00)
Glucose, Bld: 128 mg/dL — ABNORMAL HIGH (ref 70–99)
Potassium: 4.4 mEq/L (ref 3.5–5.1)
Sodium: 136 mEq/L (ref 135–145)
Total Bilirubin: 0.7 mg/dL (ref 0.2–1.2)
Total Protein: 8.1 g/dL (ref 6.0–8.3)

## 2019-11-24 LAB — LIPID PANEL
Cholesterol: 141 mg/dL (ref 0–200)
HDL: 35 mg/dL — ABNORMAL LOW (ref >39.00)
LDL Cholesterol: 76 mg/dL (ref 0–99)
NonHDL: 106.15
Total CHOL/HDL Ratio: 4
Triglycerides: 150 mg/dL — ABNORMAL HIGH (ref 0.0–149.0)
VLDL: 30 mg/dL (ref 0.0–40.0)

## 2019-11-24 LAB — FRUCTOSAMINE: Fructosamine: 321 umol/L — ABNORMAL HIGH (ref 0–285)

## 2019-11-24 LAB — HEMOGLOBIN A1C: Hgb A1c MFr Bld: 7.7 % — ABNORMAL HIGH (ref 4.6–6.5)

## 2019-11-30 ENCOUNTER — Ambulatory Visit: Payer: BC Managed Care – PPO | Admitting: Endocrinology

## 2019-11-30 ENCOUNTER — Other Ambulatory Visit: Payer: Self-pay

## 2019-12-07 ENCOUNTER — Encounter: Payer: Self-pay | Admitting: Endocrinology

## 2019-12-07 ENCOUNTER — Ambulatory Visit: Payer: BC Managed Care – PPO | Admitting: Endocrinology

## 2019-12-07 ENCOUNTER — Other Ambulatory Visit: Payer: Self-pay

## 2019-12-07 VITALS — BP 140/80 | HR 79 | Ht 68.0 in | Wt 205.0 lb

## 2019-12-07 DIAGNOSIS — N184 Chronic kidney disease, stage 4 (severe): Secondary | ICD-10-CM | POA: Diagnosis not present

## 2019-12-07 DIAGNOSIS — E119 Type 2 diabetes mellitus without complications: Secondary | ICD-10-CM | POA: Diagnosis not present

## 2019-12-07 DIAGNOSIS — Z794 Long term (current) use of insulin: Secondary | ICD-10-CM | POA: Diagnosis not present

## 2019-12-07 DIAGNOSIS — E782 Mixed hyperlipidemia: Secondary | ICD-10-CM | POA: Diagnosis not present

## 2019-12-07 LAB — GLUCOSE, POCT (MANUAL RESULT ENTRY): POC Glucose: 123 mg/dl — AB (ref 70–99)

## 2019-12-07 MED ORDER — REPAGLINIDE 0.5 MG PO TABS: 0.5000 mg | ORAL_TABLET | Freq: Every day | ORAL | 2 refills | Status: DC

## 2019-12-07 NOTE — Progress Notes (Signed)
Patient ID: Bryan Webb, male   DOB: [birth year], 49 y.o.   MRN: [id number]           Reason for Appointment: Follow-up for Type 2 Diabetes    History of Present Illness:          Date of diagnosis of type 2 diabetes mellitus: 07/2016       Background history:   He was initially diagnosed at an emergency room when he presented with high blood sugars He was initially given Lantus insulin but he was switched to Trulicity by his PCP He says he had better blood sugars with Trulicity and his N4B had gone down to 7.4 However not clear why he was put back on insulin after about 3 months and also Metformin He could not tolerate more than 1 tablet of 500 mg metformin ER  Recent history:   Most recent A1c 7.7 as before Fructosamine is higher at 321  Non-insulin hypoglycemic drugs the patient is taking are: Rybelsus 7 mg daily  Current management, blood sugar patterns and problems identified:  He does not check his blood sugars after meals despite repeated reminders  Also since he comes from work he does not bring his meter with him  He thinks his highest blood sugar has been 145 but not clear if he has checked any readings after meals at all  Most of the morning readings are in the 120s as before  Lab glucose 128 nonfasting about 2 weeks ago  He has not done any better with his weight and has gained 2 pounds  Although he thinks he is avoiding fast food he is likely not cutting back on total calories consistently  Not doing any exercise except usual walking at work  Has taken his Rybelsus fairly regularly for morning       Side effects from medications have been: Nausea from 14 mg Rybelsus     Typical meal intake: Breakfast is variable, usually 3 meals a day            Exercise: walks at home and also at work    Glucose monitoring:  done less than 1 times a day         Glucometer:   Contour       Blood Glucose readings as above    Dietician visit, most recent:  12/2018  Weight history: Maximum 240  Wt Readings from Last 3 Encounters:  12/07/19 205 lb (93 kg)  09/06/19 201 lb 3.2 oz (91.3 kg)  08/26/19 203 lb (92.1 kg)    Glycemic control:   Lab Results  Component Value Date   HGBA1C 7.7 (H) 11/23/2019   HGBA1C 7.7 (H) 08/23/2019   HGBA1C 7.7 (H) 05/18/2019   Lab Results  Component Value Date   MICROALBUR 12.9 (H) 12/02/2018   LDLCALC 76 11/23/2019   CREATININE 2.31 (H) 11/23/2019   Lab Results  Component Value Date   MICRALBCREAT 6.9 12/02/2018    Lab Results  Component Value Date   FRUCTOSAMINE 321 (H) 11/23/2019   FRUCTOSAMINE 299 (H) 05/18/2019   FRUCTOSAMINE 341 (H) 12/02/2018    Office Visit on 12/07/2019  Component Date Value Ref Range Status  . POC Glucose 12/07/2019 123* 70 - 99 mg/dl Final    Allergies as of 12/07/2019   No Known Allergies     Medication List       Accurate as of December 07, 2019 11:06 AM. If you have any questions, ask your nurse or doctor.  amLODipine 10 MG tablet Commonly known as: NORVASC Take 1 tablet (10 mg total) by mouth daily.   ezetimibe 10 MG tablet Commonly known as: Zetia Take 1 tablet (10 mg total) by mouth daily.   losartan 100 MG tablet Commonly known as: COZAAR Take 1 tablet (100 mg total) by mouth daily.   metoprolol succinate 100 MG 24 hr tablet Commonly known as: TOPROL-XL Take 1 tablet (100 mg total) by mouth daily.   rosuvastatin 20 MG tablet Commonly known as: Crestor Take 1 tablet (20 mg total) by mouth daily.   Rybelsus 7 MG Tabs Generic drug: Semaglutide Take 1 tablet by mouth daily. What changed: Another medication with the same name was removed. Continue taking this medication, and follow the directions you see here. Changed by: Elayne Snare, MD   sildenafil 100 MG tablet Commonly known as: VIAGRA Take 50 mg by mouth daily as needed.       Allergies: No Known Allergies  Past Medical History:  Diagnosis Date  . Chicken pox   .  Chronic kidney disease   . Diabetes mellitus without complication ([place])   . Hyperlipidemia   . Hypertension     History reviewed. No pertinent surgical history.  Family History  Problem Relation Age of Onset  . Hypertension Mother   . Hypertension Father   . Hypertension Maternal Grandmother   . Hypertension Maternal Grandfather   . Hypertension Paternal Grandmother   . Diabetes Paternal Grandmother   . Hypertension Paternal Grandfather     Social History:  reports that he has been smoking. He has a 5.00 pack-year smoking history. He has never used smokeless tobacco. He reports that he does not drink alcohol or use drugs.   Review of Systems   Lipid history: He was started on Crestor 20 mg when he was not tolerating Lipitor  On his last visit he had forgotten to continue his Crestor and was only on Zetia With this his LDL has come down well below 100, previously has been consistently higher Triglycerides are relatively better Also has low HDL   Lab Results  Component Value Date   CHOL 141 11/23/2019   CHOL 232 (H) 08/23/2019   CHOL 201 (H) 05/18/2019   Lab Results  Component Value Date   HDL 35.00 (L) 11/23/2019   HDL 35.60 (L) 08/23/2019   HDL 35.40 (L) 05/18/2019   Lab Results  Component Value Date   LDLCALC 76 11/23/2019   LDLCALC 131 (H) 05/18/2019   LDLCALC 111 (H) 02/01/2019   Lab Results  Component Value Date   TRIG 150.0 (H) 11/23/2019   TRIG 263.0 (H) 08/23/2019   TRIG 173.0 (H) 05/18/2019   Lab Results  Component Value Date   CHOLHDL 4 11/23/2019   CHOLHDL 7 08/23/2019   CHOLHDL 6 05/18/2019   Lab Results  Component Value Date   LDLDIRECT 158.0 08/23/2019   LDLDIRECT 178.0 09/21/2018            Hypertension: Has been treated with losartan 100 mg, clonidine 0.1, metoprolol 100 mg and amlodipine 10 mg daily On treatment for several years  Home BP is checked regularly, followed by nephrologist also    BP Readings from Last 3  Encounters:  12/07/19 140/80  09/06/19 138/84  08/26/19 (!) 142/80    Most recent eye exam was in 5/20  Most recent foot exam: 10/2018  Currently known complications of diabetes: Erectile dysfunction  Chronic kidney disease: stable, also seen by nephrologist   Lab Results  Component Value Date   CREATININE 2.31 (H) 11/23/2019   CREATININE 2.49 (H) 08/23/2019   CREATININE 2.33 (H) 05/18/2019   Reportedly has fatty liver with persistently high liver functions, slightly better than before  Lab Results  Component Value Date   ALT 49 11/23/2019     LABS:  Office Visit on 12/07/2019  Component Date Value Ref Range Status  . POC Glucose 12/07/2019 123* 70 - 99 mg/dl Final    Physical Examination:  BP 140/80 (BP Location: Left Arm, Patient Position: Sitting, Cuff Size: Normal)   Pulse 79   Ht 5\' 8"  (1.727 m)   Wt 205 lb (93 kg)   SpO2 97%   BMI 31.17 kg/m   No pedal edema     ASSESSMENT:  Diabetes type 2, and obesity  See history of present illness for detailed discussion of current diabetes management, blood sugar patterns and problems identified  His A1c is unchanged at 7.7 However fructosamine of 321 appears to be higher than before Likely has some postprandial hyperglycemia which he does not monitor  Only on Rybelsus 7 mg, has previously not wanted to use injectable medications and cannot tolerate 14 mg Rybelsus Since he is eating mostly 2 meals a day and his sugars likely may be higher after dinner at least will need to add another medication Also he can probably work harder on weight loss with better diet and exercising at least on his days off  Hypercholesterolemia: He has significant hyperlipidemia LDL is back to goal with adding Crestor in addition to his Zetia  HYPERTENSION: Blood pressure is again fairly good, to continue follow-up with nephrologist  PLAN:    Prandin 0.5 mg at dinnertime He will call if he is still has blood sugars over 180  after evening meal Needs to bring his monitor for download on each visit Check blood sugars more consistently after meal meals and not just in the morning    HYPERTENSION: He will need to follow-up regularly with PCP and nephrologist  LIPIDS: No change in Zetia or Crestor  Probable fatty liver: We will defer any management to PCP, currently is showing improvement in his liver functions    There are no Patient Instructions on file for this visit.      Elayne Snare 12/07/2019, 11:06 AM   Note: This office note was prepared with Dragon voice recognition system technology. Any transcriptional errors that result from this process are unintentional.

## 2019-12-07 NOTE — Patient Instructions (Addendum)
Check blood sugars on waking up  days a week  Also check blood sugars about 2 hours after meals and do this after different meals by rotation  Recommended blood sugar levels on waking up are 90-130 and about 2 hours after meal is 130-160  Please bring your blood sugar monitor to each visit, thank you  Prandin just before supper or upto 30 min before supper

## 2019-12-12 ENCOUNTER — Other Ambulatory Visit: Payer: Self-pay | Admitting: Endocrinology

## 2019-12-19 DIAGNOSIS — G4733 Obstructive sleep apnea (adult) (pediatric): Secondary | ICD-10-CM | POA: Diagnosis not present

## 2020-01-03 DIAGNOSIS — I129 Hypertensive chronic kidney disease with stage 1 through stage 4 chronic kidney disease, or unspecified chronic kidney disease: Secondary | ICD-10-CM | POA: Diagnosis not present

## 2020-01-03 DIAGNOSIS — N179 Acute kidney failure, unspecified: Secondary | ICD-10-CM | POA: Diagnosis not present

## 2020-01-03 DIAGNOSIS — R809 Proteinuria, unspecified: Secondary | ICD-10-CM | POA: Diagnosis not present

## 2020-01-03 DIAGNOSIS — N183 Chronic kidney disease, stage 3 unspecified: Secondary | ICD-10-CM | POA: Diagnosis not present

## 2020-01-12 IMAGING — US US ABDOMEN COMPLETE
1 series · 13 of 25 positions shown · non-contrast
Comparison: None.

CLINICAL DATA: Elevated LFTs

EXAM:
ABDOMEN ULTRASOUND COMPLETE

[Series 1: us abdomen complete · 0.23mm/px · 13 of 87 slices shown]
[im 1/87]
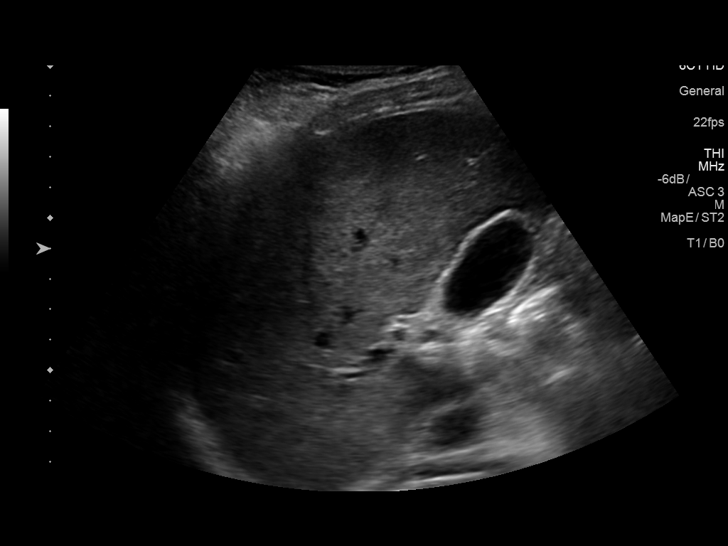
[im 8/87]
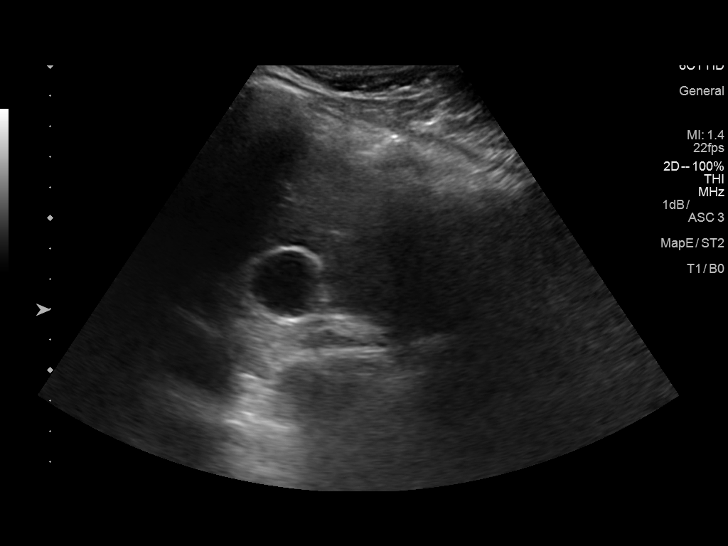
[im 15/87]
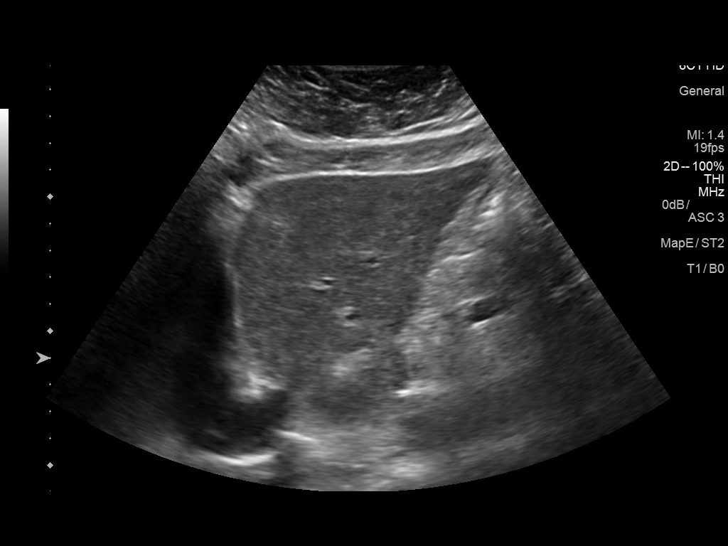
[im 22/87]
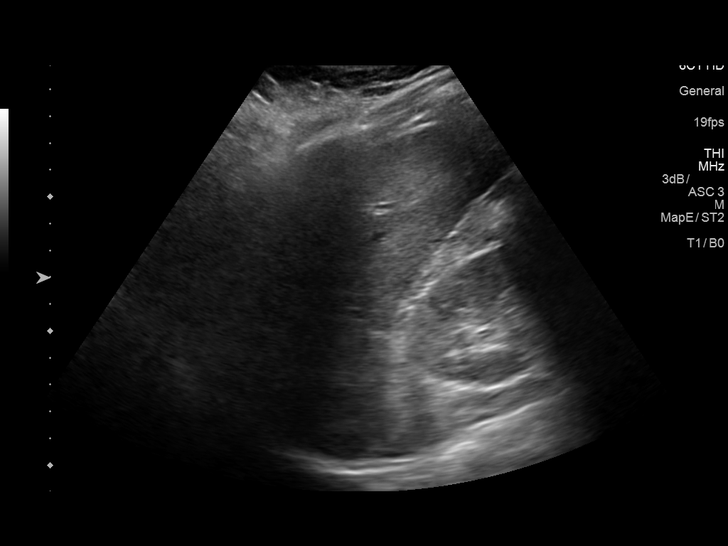
[im 29/87]
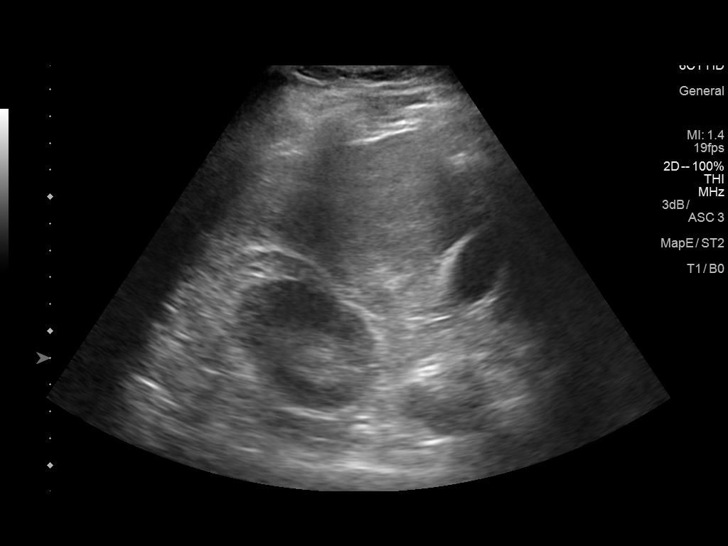
[im 36/87]
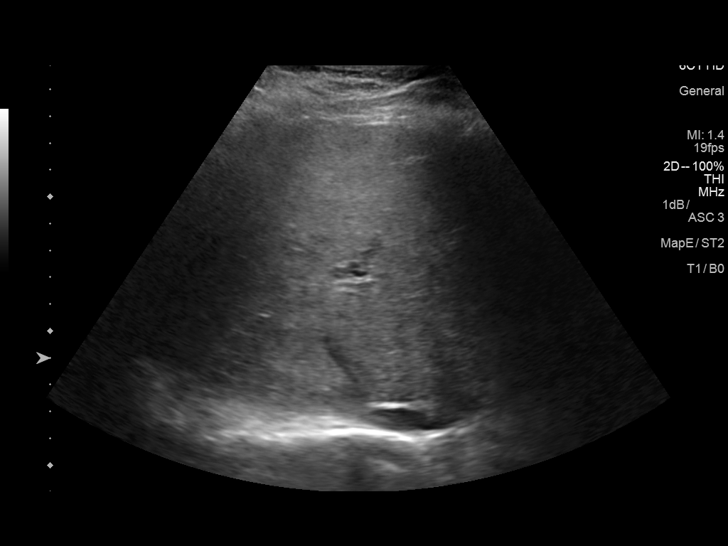
[im 44/87]
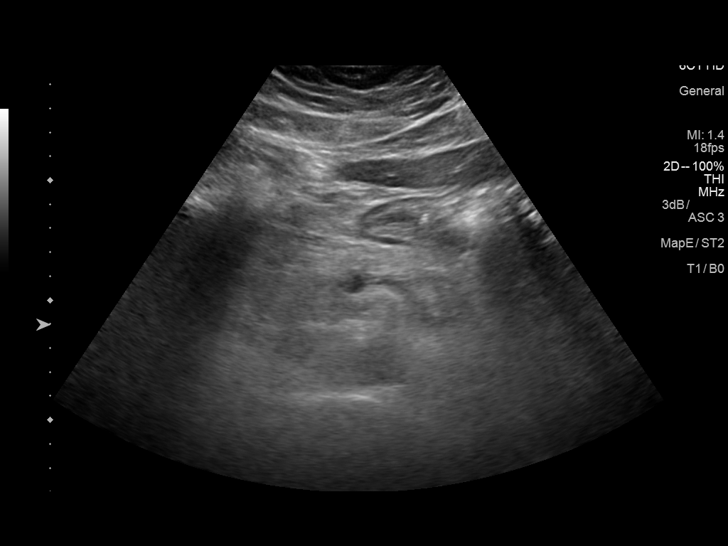
[im 51/87]
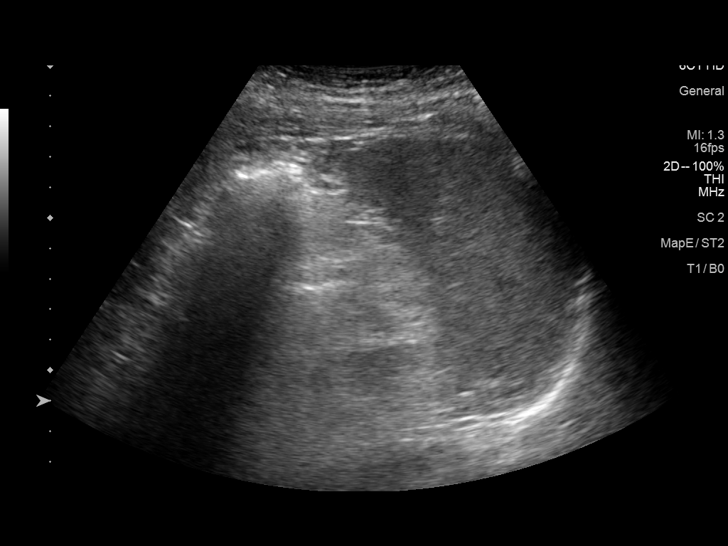
[im 58/87]
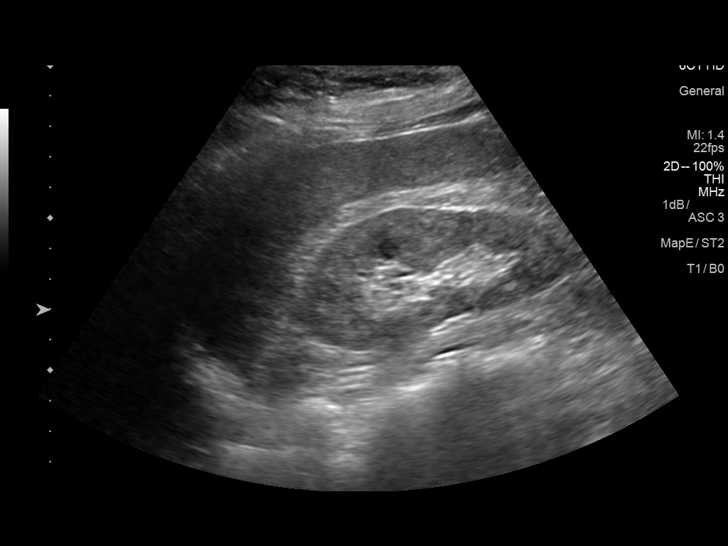
[im 65/87]
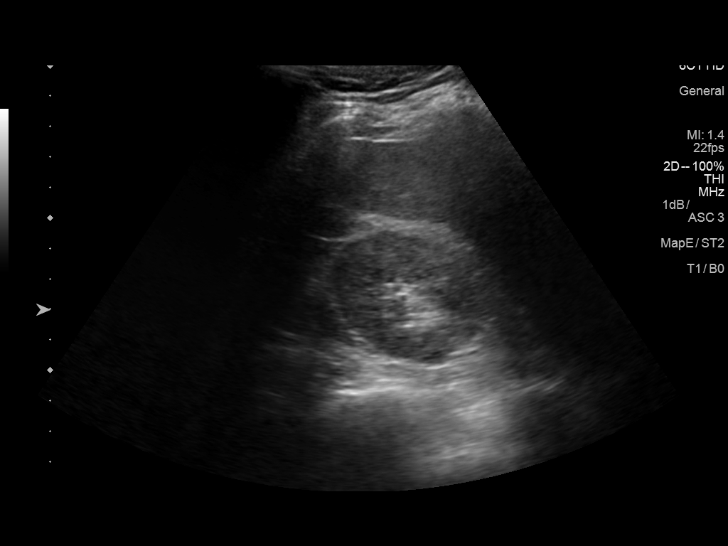
[im 72/87]
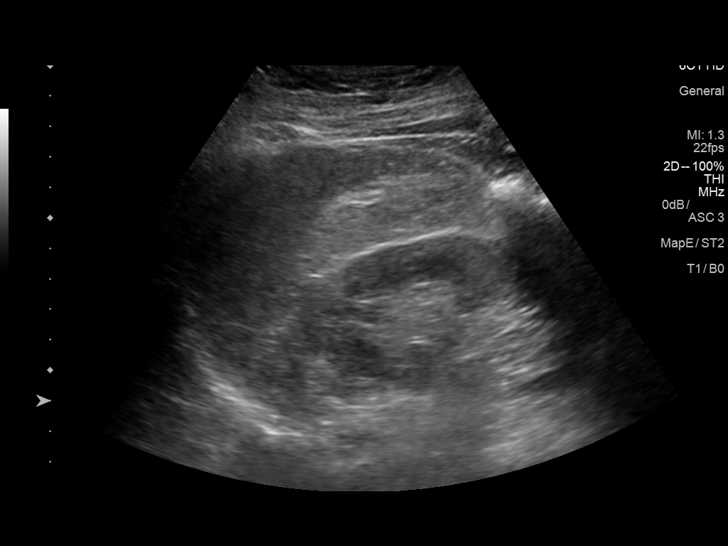
[im 79/87]
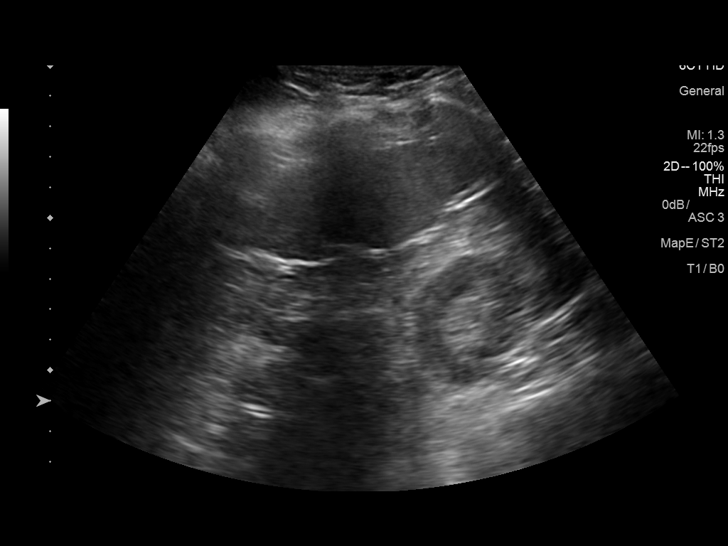
[im 87/87]
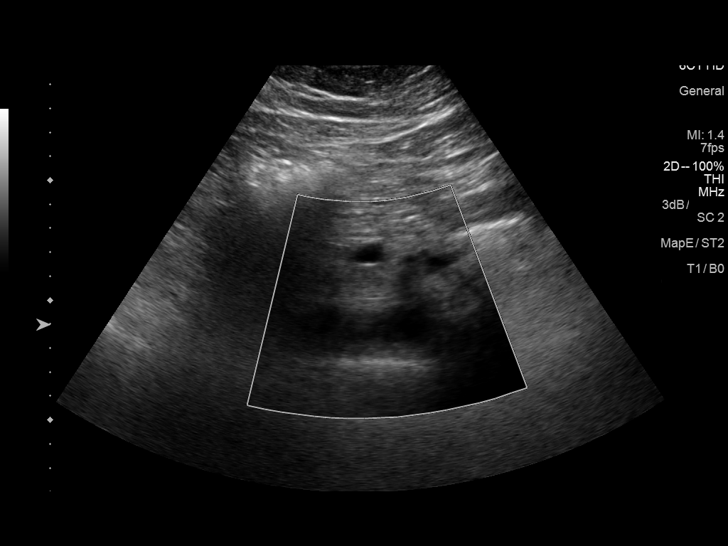

[13 of 25 positions shown; findings below may reference images not displayed]

FINDINGS: Gallbladder: No gallstones or wall thickening visualized. No
sonographic Murphy sign noted by sonographer.

Common bile duct: Diameter: 2.3 mm

Liver: Mild increased hepatic echogenicity with steatosis fatty
infiltration. No focal hepatic abnormality or biliary dilatation.
Portal vein is patent on color Doppler imaging with normal direction
of blood flow towards the liver.

IVC: No abnormality visualized.

Pancreas: Visualized portion unremarkable.

Spleen: Size and appearance within normal limits.

Right Kidney: Length: 9.6 cm. Mild increased cortical echogenicity.
Normal cortical thickness. No hydronephrosis. Appearance remains
nonspecific for mild medical renal disease. Correlate with renal
function test.

Left Kidney: Length: 10.6 cm. Similar mild increased cortical
echogenicity. Normal cortical thickness. No hydronephrosis. No focal
abnormality.

Abdominal aorta: No aneurysm visualized.

Other findings: No free fluid or ascites.
IMPRESSION: Mild hepatic steatosis.

No acute finding by ultrasound

Nonspecific mild increased renal echogenicity bilaterally suggesting
mild medical renal disease. Correlate with renal function test.

## 2020-01-18 DIAGNOSIS — G4733 Obstructive sleep apnea (adult) (pediatric): Secondary | ICD-10-CM | POA: Diagnosis not present

## 2020-02-18 DIAGNOSIS — G4733 Obstructive sleep apnea (adult) (pediatric): Secondary | ICD-10-CM | POA: Diagnosis not present

## 2020-03-06 ENCOUNTER — Other Ambulatory Visit: Payer: BC Managed Care – PPO

## 2020-03-09 ENCOUNTER — Ambulatory Visit: Payer: BC Managed Care – PPO | Admitting: Endocrinology

## 2020-03-17 ENCOUNTER — Encounter: Payer: Self-pay | Admitting: Family

## 2020-03-17 ENCOUNTER — Ambulatory Visit: Payer: BC Managed Care – PPO | Admitting: Family

## 2020-03-17 ENCOUNTER — Other Ambulatory Visit: Payer: Self-pay

## 2020-03-17 VITALS — BP 150/92 | HR 63 | Temp 98.2°F | Ht 68.0 in | Wt 191.5 lb

## 2020-03-17 DIAGNOSIS — I10 Essential (primary) hypertension: Secondary | ICD-10-CM | POA: Diagnosis not present

## 2020-03-17 DIAGNOSIS — Z1211 Encounter for screening for malignant neoplasm of colon: Secondary | ICD-10-CM

## 2020-03-17 DIAGNOSIS — M545 Low back pain, unspecified: Secondary | ICD-10-CM

## 2020-03-17 MED ORDER — METHOCARBAMOL 500 MG PO TABS: 500.0000 mg | ORAL_TABLET | Freq: Three times a day (TID) | ORAL | 0 refills | Status: DC | PRN

## 2020-03-17 MED ORDER — MELOXICAM 15 MG PO TABS: 15.0000 mg | ORAL_TABLET | Freq: Every day | ORAL | 0 refills | Status: DC

## 2020-03-17 NOTE — Progress Notes (Signed)
Bryan Webb is a 49 y.o. male with the following history as recorded in [employer]:  Patient Active Problem List   Diagnosis Date Noted  . Retrognathia 06/28/2019  . Non-restorative sleep 06/28/2019  . Snoring 06/28/2019  . Vasculogenic erectile dysfunction 06/28/2019  . Concern about STD in male without diagnosis 04/17/2017  . Essential hypertension 01/03/2017  . Type 2 diabetes mellitus ([place]) 08/21/2016    Current Outpatient Medications  Medication Sig Dispense Refill  . amLODipine (NORVASC) 10 MG tablet Take 1 tablet (10 mg total) by mouth daily. 90 tablet 3  . ezetimibe (ZETIA) 10 MG tablet Take 1 tablet (10 mg total) by mouth daily. 30 tablet 3  . losartan (COZAAR) 100 MG tablet Take 1 tablet (100 mg total) by mouth daily. 90 tablet 3  . metoprolol succinate (TOPROL-XL) 100 MG 24 hr tablet Take 1 tablet (100 mg total) by mouth daily. 90 tablet 3  . repaglinide (PRANDIN) 0.5 MG tablet Take 1 tablet (0.5 mg total) by mouth daily before supper. 30 tablet 2  . rosuvastatin (CRESTOR) 20 MG tablet Take 1 tablet (20 mg total) by mouth daily. 30 tablet 3  . RYBELSUS 7 MG TABS TAKE 1 TABLET BY MOUTH ONCE DAILY 30  MINUTES  BEFORE  BREAKFAST  WITH  WATER 30 tablet 1  . sildenafil (VIAGRA) 100 MG tablet Take 50 mg by mouth daily as needed.    . meloxicam (MOBIC) 15 MG tablet Take 1 tablet (15 mg total) by mouth daily. 30 tablet 0  . methocarbamol (ROBAXIN) 500 MG tablet Take 1 tablet (500 mg total) by mouth every 8 (eight) hours as needed for muscle spasms. 30 tablet 0   No current facility-administered medications for this visit.    Allergies: Patient has no known allergies.  Past Medical History:  Diagnosis Date  . Chicken pox   . Chronic kidney disease   . Diabetes mellitus without complication ([place])   . Hyperlipidemia   . Hypertension     History reviewed. No pertinent surgical history.  Family History  Problem Relation Age of Onset  . Hypertension Mother   . Hypertension  Father   . Hypertension Maternal Grandmother   . Hypertension Maternal Grandfather   . Hypertension Paternal Grandmother   . Diabetes Paternal Grandmother   . Hypertension Paternal Grandfather     Social History   Tobacco Use  . Smoking status: Current Every Day Smoker    Packs/day: 0.25    Years: 20.00    Pack years: 5.00  . Smokeless tobacco: Never Used  . Tobacco comment: considering quitting completely in the next year  Substance Use Topics  . Alcohol use: No    Subjective:  Low back pain x 1 1/2  week; no known injury or trauma; feels like it is localized over right side; worse with certain movements; no numbness or tingling radiating; no changes with urination;  Has tried Biofreeze with some relief; some relief with heating pad;   Notes blood pressure is up today because he has not taken his medication;   Also requesting referral for baseline colonoscopy;   Objective:  Vitals:   03/17/20 0952  BP: (!) 150/92  Pulse: 63  Temp: 98.2 F (36.8 C)  TempSrc: Oral  SpO2: 98%  Weight: 191 lb 8 oz (86.9 kg)  Height: 5\' 8"  (1.727 m)    General: Well developed, well nourished, in no acute distress  Skin : Warm and dry.  Head: Normocephalic and atraumatic  Lungs: Respirations  unlabored; clear to auscultation bilaterally without wheeze, rales, rhonchi  Musculoskeletal: No deformities; no active joint inflammation  Extremities: No edema, cyanosis, clubbing  Vessels: Symmetric bilaterally  Neurologic: Alert and oriented; speech intact; face symmetrical; moves all extremities well; CNII-XII intact without focal deficit   Assessment:  1. Acute low back pain without sciatica, unspecified back pain laterality   2. Encounter for screening colonoscopy   3. Essential hypertension     Plan:  1. Suspect muscular; Rx for Mobic and Robaxin; continue to rest and apply heat as able; follow-up worse, no better. 2. Refer to GI; 3. Needs to take medication daily; keep planned  appointment for CPE in December 2021.  This visit occurred during the SARS-CoV-2 public health emergency.  Safety protocols were in place, including screening questions prior to the visit, additional usage of staff PPE, and extensive cleaning of exam room while observing appropriate contact time as indicated for disinfecting solutions.     No follow-ups on file.  Orders Placed This Encounter  Procedures  . Ambulatory referral to Gastroenterology    Referral Priority:   Routine    Referral Type:   Consultation    Referral Reason:   Specialty Services Required    Number of Visits Requested:   1    Requested Prescriptions   Signed Prescriptions Disp Refills  . meloxicam (MOBIC) 15 MG tablet 30 tablet 0    Sig: Take 1 tablet (15 mg total) by mouth daily.  . methocarbamol (ROBAXIN) 500 MG tablet 30 tablet 0    Sig: Take 1 tablet (500 mg total) by mouth every 8 (eight) hours as needed for muscle spasms.

## 2020-03-19 DIAGNOSIS — G4733 Obstructive sleep apnea (adult) (pediatric): Secondary | ICD-10-CM | POA: Diagnosis not present

## 2020-03-22 ENCOUNTER — Encounter: Payer: Self-pay | Admitting: Internal Medicine

## 2020-03-29 ENCOUNTER — Other Ambulatory Visit (INDEPENDENT_AMBULATORY_CARE_PROVIDER_SITE_OTHER): Payer: BC Managed Care – PPO

## 2020-03-29 ENCOUNTER — Other Ambulatory Visit: Payer: Self-pay

## 2020-03-29 DIAGNOSIS — Z794 Long term (current) use of insulin: Secondary | ICD-10-CM | POA: Diagnosis not present

## 2020-03-29 DIAGNOSIS — E119 Type 2 diabetes mellitus without complications: Secondary | ICD-10-CM | POA: Diagnosis not present

## 2020-03-30 LAB — COMPREHENSIVE METABOLIC PANEL
ALT: 53 U/L (ref 0–53)
AST: 45 U/L — ABNORMAL HIGH (ref 0–37)
Albumin: 4.4 g/dL (ref 3.5–5.2)
Alkaline Phosphatase: 158 U/L — ABNORMAL HIGH (ref 39–117)
BUN: 48 mg/dL — ABNORMAL HIGH (ref 6–23)
CO2: 23 mEq/L (ref 19–32)
Calcium: 9.9 mg/dL (ref 8.4–10.5)
Chloride: 103 mEq/L (ref 96–112)
Creatinine, Ser: 2.65 mg/dL — ABNORMAL HIGH (ref 0.40–1.50)
GFR: 31.01 mL/min — ABNORMAL LOW (ref >60.00)
Glucose, Bld: 208 mg/dL — ABNORMAL HIGH (ref 70–99)
Potassium: 5 mEq/L (ref 3.5–5.1)
Sodium: 135 mEq/L (ref 135–145)
Total Bilirubin: 0.8 mg/dL (ref 0.2–1.2)
Total Protein: 8.1 g/dL (ref 6.0–8.3)

## 2020-03-30 LAB — HEMOGLOBIN A1C: Hgb A1c MFr Bld: 7.8 % — ABNORMAL HIGH (ref 4.6–6.5)

## 2020-03-30 LAB — FRUCTOSAMINE: Fructosamine: 311 umol/L — ABNORMAL HIGH (ref 0–285)

## 2020-04-03 ENCOUNTER — Other Ambulatory Visit: Payer: Self-pay

## 2020-04-03 ENCOUNTER — Ambulatory Visit: Payer: BC Managed Care – PPO | Admitting: Endocrinology

## 2020-04-03 ENCOUNTER — Encounter: Payer: Self-pay | Admitting: Endocrinology

## 2020-04-03 VITALS — BP 130/80 | HR 61 | Ht 68.0 in | Wt 187.0 lb

## 2020-04-03 DIAGNOSIS — I10 Essential (primary) hypertension: Secondary | ICD-10-CM

## 2020-04-03 DIAGNOSIS — R7989 Other specified abnormal findings of blood chemistry: Secondary | ICD-10-CM

## 2020-04-03 DIAGNOSIS — Z794 Long term (current) use of insulin: Secondary | ICD-10-CM

## 2020-04-03 DIAGNOSIS — E119 Type 2 diabetes mellitus without complications: Secondary | ICD-10-CM

## 2020-04-03 DIAGNOSIS — E782 Mixed hyperlipidemia: Secondary | ICD-10-CM | POA: Diagnosis not present

## 2020-04-03 LAB — GLUCOSE, POCT (MANUAL RESULT ENTRY): POC Glucose: 150 mg/dl — AB (ref 70–99)

## 2020-04-03 NOTE — Progress Notes (Signed)
Patient ID: Bryan Webb, male   DOB: Jan 24, 1971, 49 y.o.   MRN: [id number]           Reason for Appointment: Follow-up    History of Present Illness:          Date of diagnosis of type 2 diabetes mellitus: 07/2016       Background history:   He was initially diagnosed at an emergency room when he presented with high blood sugars He was initially given Lantus insulin but he was switched to Trulicity by his PCP He says he had better blood sugars with Trulicity and his K9F had gone down to 7.4 However not clear why he was put back on insulin after about 3 months and also Metformin He could not tolerate more than 1 tablet of 500 mg metformin ER  Recent history:   Most recent A1c is is now 7.8 Fructosamine is 311, was at 321  Non-insulin hypoglycemic drugs the patient is taking are: Rybelsus 7 mg daily hs  Current management, blood sugar patterns and problems identified:  He did not bring his meter again  He was told to take Prandin at dinnertime but he is taking it at bedtime  He says he has been off his diet and drinking regular soft drinks  Although he is overall working on cutting back on his high calorie meals and losing weight his blood sugars are not better  Not clear if his blood sugars are controlled after meals since he usually forgets to check the readings after work despite repeated reminders  He does seemed to be more active although not doing a lot of formal exercise  Has taken his Rybelsus before eating regularly for morning       Side effects from medications have been: Nausea from 14 mg Rybelsus     Typical meal intake: Breakfast is variable, usually 3 meals a day            Exercise: walks at home and also at work    Glucose monitoring:  done less than 1 times a day         Glucometer:   Contour       Blood Glucose readings in the morning recently 118-135 Not checking after meals   Dietician visit, most recent: 12/2018  Weight history: Maximum  240  Wt Readings from Last 3 Encounters:  04/03/20 187 lb (84.8 kg)  03/17/20 191 lb 8 oz (86.9 kg)  12/07/19 205 lb (93 kg)    Glycemic control:   Lab Results  Component Value Date   HGBA1C 7.8 (H) 03/29/2020   HGBA1C 7.7 (H) 11/23/2019   HGBA1C 7.7 (H) 08/23/2019   Lab Results  Component Value Date   MICROALBUR 12.9 (H) 12/02/2018   LDLCALC 76 11/23/2019   CREATININE 2.65 (H) 03/29/2020   Lab Results  Component Value Date   MICRALBCREAT 6.9 12/02/2018    Lab Results  Component Value Date   FRUCTOSAMINE 311 (H) 03/29/2020   FRUCTOSAMINE 321 (H) 11/23/2019   FRUCTOSAMINE 299 (H) 05/18/2019    Office Visit on 04/03/2020  Component Date Value Ref Range Status  . POC Glucose 04/03/2020 150* 70 - 99 mg/dl Final  Lab on 03/29/2020  Component Date Value Ref Range Status  . Fructosamine 03/29/2020 311* 0 - 285 umol/L Final   Comment: Published reference interval for apparently healthy subjects between age 12 and 47 is 22 - 285 umol/L and in a poorly controlled diabetic population is 228 -  563 umol/L with a mean of 396 umol/L.   Marland Kitchen Sodium 03/29/2020 135  135 - 145 mEq/L Final  . Potassium 03/29/2020 5.0  3.5 - 5.1 mEq/L Final  . Chloride 03/29/2020 103  96 - 112 mEq/L Final  . CO2 03/29/2020 23  19 - 32 mEq/L Final  . Glucose, Bld 03/29/2020 208* 70 - 99 mg/dL Final  . BUN 03/29/2020 48* 6 - 23 mg/dL Final  . Creatinine, Ser 03/29/2020 2.65* 0.40 - 1.50 mg/dL Final  . Total Bilirubin 03/29/2020 0.8  0.2 - 1.2 mg/dL Final  . Alkaline Phosphatase 03/29/2020 158* 39 - 117 U/L Final  . AST 03/29/2020 45* 0 - 37 U/L Final  . ALT 03/29/2020 53  0 - 53 U/L Final  . Total Protein 03/29/2020 8.1  6.0 - 8.3 g/dL Final  . Albumin 03/29/2020 4.4  3.5 - 5.2 g/dL Final  . GFR 03/29/2020 31.01* >60.00 mL/min Final  . Calcium 03/29/2020 9.9  8.4 - 10.5 mg/dL Final  . Hgb A1c MFr Bld 03/29/2020 7.8* 4.6 - 6.5 % Final   Glycemic Control Guidelines for People with Diabetes:Non  Diabetic:  <6%Goal of Therapy: <7%Additional Action Suggested:  >8%     Allergies as of 04/03/2020   No Known Allergies     Medication List       Accurate as of April 03, 2020 11:59 PM. If you have any questions, ask your nurse or doctor.        amLODipine 10 MG tablet Commonly known as: NORVASC Take 1 tablet (10 mg total) by mouth daily.   ezetimibe 10 MG tablet Commonly known as: Zetia Take 1 tablet (10 mg total) by mouth daily.   losartan 100 MG tablet Commonly known as: COZAAR Take 1 tablet (100 mg total) by mouth daily.   meloxicam 15 MG tablet Commonly known as: MOBIC Take 1 tablet (15 mg total) by mouth daily.   methocarbamol 500 MG tablet Commonly known as: Robaxin Take 1 tablet (500 mg total) by mouth every 8 (eight) hours as needed for muscle spasms.   metoprolol succinate 100 MG 24 hr tablet Commonly known as: TOPROL-XL Take 1 tablet (100 mg total) by mouth daily.   repaglinide 0.5 MG tablet Commonly known as: PRANDIN Take 1 tablet (0.5 mg total) by mouth daily before supper.   rosuvastatin 20 MG tablet Commonly known as: Crestor Take 1 tablet (20 mg total) by mouth daily.   Rybelsus 7 MG Tabs Generic drug: Semaglutide TAKE 1 TABLET BY MOUTH ONCE DAILY 30  MINUTES  BEFORE  BREAKFAST  WITH  WATER   sildenafil 100 MG tablet Commonly known as: VIAGRA Take 50 mg by mouth daily as needed.       Allergies: No Known Allergies  Past Medical History:  Diagnosis Date  . Chicken pox   . Chronic kidney disease   . Diabetes mellitus without complication ([place])   . Hyperlipidemia   . Hypertension     History reviewed. No pertinent surgical history.  Family History  Problem Relation Age of Onset  . Hypertension Mother   . Hypertension Father   . Hypertension Maternal Grandmother   . Hypertension Maternal Grandfather   . Hypertension Paternal Grandmother   . Diabetes Paternal Grandmother   . Hypertension Paternal Grandfather     Social History:   reports that he has been smoking. He has a 5.00 pack-year smoking history. He has never used smokeless tobacco. He reports that he does not drink alcohol and does  not use drugs.   Review of Systems   Lipid history: He was started on Crestor 20 mg when he was not tolerating Lipitor  On his last visit he had forgotten to continue his Crestor and was only on Zetia With this his LDL recently is below 100, previously has been consistently higher Triglycerides are relatively better Also has low HDL   Lab Results  Component Value Date   CHOL 141 11/23/2019   CHOL 232 (H) 08/23/2019   CHOL 201 (H) 05/18/2019   Lab Results  Component Value Date   HDL 35.00 (L) 11/23/2019   HDL 35.60 (L) 08/23/2019   HDL 35.40 (L) 05/18/2019   Lab Results  Component Value Date   LDLCALC 76 11/23/2019   LDLCALC 131 (H) 05/18/2019   LDLCALC 111 (H) 02/01/2019   Lab Results  Component Value Date   TRIG 150.0 (H) 11/23/2019   TRIG 263.0 (H) 08/23/2019   TRIG 173.0 (H) 05/18/2019   Lab Results  Component Value Date   CHOLHDL 4 11/23/2019   CHOLHDL 7 08/23/2019   CHOLHDL 6 05/18/2019   Lab Results  Component Value Date   LDLDIRECT 158.0 08/23/2019   LDLDIRECT 178.0 09/21/2018            Hypertension: Has been treated with losartan 100 mg, clonidine 0.1, metoprolol 100 mg and amlodipine 10 mg daily On treatment for several years  Home BP is checked regularly, followed by nephrologist also   BP Readings from Last 3 Encounters:  04/03/20 130/80  03/17/20 (!) 150/92  12/07/19 140/80    Most recent eye exam was in 5/20  Most recent foot exam: 10/2018  Currently known complications of diabetes: Erectile dysfunction  Chronic kidney disease: Values as below, also seen by nephrologist   Lab Results  Component Value Date   CREATININE 2.65 (H) 03/29/2020   CREATININE 2.31 (H) 11/23/2019   CREATININE 2.49 (H) 08/23/2019   Reportedly has fatty liver with persistently high liver  functions, slightly better than before  Lab Results  Component Value Date   ALT 53 03/29/2020     LABS:  Office Visit on 04/03/2020  Component Date Value Ref Range Status  . POC Glucose 04/03/2020 150* 70 - 99 mg/dl Final  Lab on 03/29/2020  Component Date Value Ref Range Status  . Fructosamine 03/29/2020 311* 0 - 285 umol/L Final   Comment: Published reference interval for apparently healthy subjects between age 47 and 41 is 43 - 285 umol/L and in a poorly controlled diabetic population is 228 - 563 umol/L with a mean of 396 umol/L.   Marland Kitchen Sodium 03/29/2020 135  135 - 145 mEq/L Final  . Potassium 03/29/2020 5.0  3.5 - 5.1 mEq/L Final  . Chloride 03/29/2020 103  96 - 112 mEq/L Final  . CO2 03/29/2020 23  19 - 32 mEq/L Final  . Glucose, Bld 03/29/2020 208* 70 - 99 mg/dL Final  . BUN 03/29/2020 48* 6 - 23 mg/dL Final  . Creatinine, Ser 03/29/2020 2.65* 0.40 - 1.50 mg/dL Final  . Total Bilirubin 03/29/2020 0.8  0.2 - 1.2 mg/dL Final  . Alkaline Phosphatase 03/29/2020 158* 39 - 117 U/L Final  . AST 03/29/2020 45* 0 - 37 U/L Final  . ALT 03/29/2020 53  0 - 53 U/L Final  . Total Protein 03/29/2020 8.1  6.0 - 8.3 g/dL Final  . Albumin 03/29/2020 4.4  3.5 - 5.2 g/dL Final  . GFR 03/29/2020 31.01* >60.00 mL/min Final  . Calcium 03/29/2020  9.9  8.4 - 10.5 mg/dL Final  . Hgb A1c MFr Bld 03/29/2020 7.8* 4.6 - 6.5 % Final   Glycemic Control Guidelines for People with Diabetes:Non Diabetic:  <6%Goal of Therapy: <7%Additional Action Suggested:  >8%     Physical Examination:  BP 130/80 (BP Location: Left Arm, Patient Position: Sitting, Cuff Size: Normal)   Pulse 61   Ht 5\' 8"  (1.727 m)   Wt 187 lb (84.8 kg)   SpO2 98%   BMI 28.43 kg/m       ASSESSMENT:  Diabetes type 2, and obesity  See history of present illness for detailed discussion of current diabetes management, blood sugar patterns and problems identified  His A1c is unchanged at 7.8  Although he has lost weight his  blood sugars are still likely higher after meals  Still on monotherapy on Rybelsus 7 mg, has previously not wanted to use injectable medications and cannot tolerate 14 mg Rybelsus Is not taking his Prandin at dinnertime and at bedtime by mistake Also likely getting significant hypoglycemia at times from drinking regular soft drinks Today despite eating only a snack in the morning his blood sugar is still relatively high at 150 He forgets to check his sugars after meals  Hypercholesterolemia: He has had good control as of the last visit  HYPERTENSION: Blood pressure is fairly good, to continue follow-up with nephrologist  Mild increase in liver functions: Likely from fatty liver but will continue to monitor, also to follow-up with PCP  PLAN:    Prandin 0.5 mg at dinnertime and also if he is eating a larger meal at lunchtime May consider increasing the dose if readings are higher after meals but he needs to start checking readings after dinner at least a few times a week  Needs to let us know if blood sugars are over 180 after his main meal of the day Needs to bring his monitor for download on each visit Avoid regular soft drinks completely     Patient Instructions  Take Repeglanide before main meal of the day..  Check blood sugars on waking up 2-3 days a week  Also check blood sugars about 2 hours after meals and do this after different meals by rotation  Recommended blood sugar levels on waking up are 90-130 and about 2 hours after meal is 130-160  Please bring your blood sugar monitor to each visit, thank you         Elayne Snare 04/04/2020, 10:19 AM   Note: This office note was prepared with Dragon voice recognition system technology. Any transcriptional errors that result from this process are unintentional.

## 2020-04-03 NOTE — Patient Instructions (Addendum)
Take Repeglanide before main meal of the day..  Check blood sugars on waking up 2-3 days a week  Also check blood sugars about 2 hours after meals and do this after different meals by rotation  Recommended blood sugar levels on waking up are 90-130 and about 2 hours after meal is 130-160  Please bring your blood sugar monitor to each visit, thank you

## 2020-04-19 DIAGNOSIS — G4733 Obstructive sleep apnea (adult) (pediatric): Secondary | ICD-10-CM | POA: Diagnosis not present

## 2020-04-27 ENCOUNTER — Other Ambulatory Visit: Payer: Self-pay | Admitting: Endocrinology

## 2020-05-02 ENCOUNTER — Ambulatory Visit (AMBULATORY_SURGERY_CENTER): Payer: Self-pay | Admitting: *Deleted

## 2020-05-02 ENCOUNTER — Other Ambulatory Visit: Payer: Self-pay

## 2020-05-02 VITALS — Ht 68.0 in | Wt 188.0 lb

## 2020-05-02 DIAGNOSIS — Z1211 Encounter for screening for malignant neoplasm of colon: Secondary | ICD-10-CM

## 2020-05-02 MED ORDER — SUTAB 1479-225-188 MG PO TABS: 24.0000 | ORAL_TABLET | ORAL | 0 refills | Status: DC

## 2020-05-02 NOTE — Progress Notes (Signed)
cov  vaxx x 2   No egg or soy allergy known to patient  No issues with past sedation with any surgeries or procedures no intubation problems in the past  No FH of Malignant Hyperthermia No diet pills per patient No home 02 use per patient  No blood thinners per patient  Pt denies issues with constipation  No A fib or A flutter  EMMI video to pt or via [place] 19 guidelines implemented in PV today with Pt and RN   sutab  Coupon given to pt in PV today , Code to Pharmacy   Due to the COVID-19 pandemic we are asking patients to follow these guidelines. Please only bring one care partner. Please be aware that your care partner may wait in the car in the parking lot or if they feel like they will be too hot to wait in the car, they may wait in the lobby on the 4th floor. All care partners are required to wear a mask the entire time (we do not have any that we can provide them), they need to practice social distancing, and we will do a Covid check for all patient's and care partners when you arrive. Also we will check their temperature and your temperature. If the care partner waits in their car they need to stay in the parking lot the entire time and we will call them on their cell phone when the patient is ready for discharge so they can bring the car to the front of the building. Also all patient's will need to wear a mask into building.

## 2020-05-03 ENCOUNTER — Encounter: Payer: Self-pay | Admitting: Internal Medicine

## 2020-05-16 ENCOUNTER — Other Ambulatory Visit: Payer: Self-pay

## 2020-05-16 ENCOUNTER — Ambulatory Visit (AMBULATORY_SURGERY_CENTER): Payer: BC Managed Care – PPO | Admitting: Internal Medicine

## 2020-05-16 ENCOUNTER — Encounter: Payer: Self-pay | Admitting: Internal Medicine

## 2020-05-16 VITALS — BP 114/75 | HR 49 | Temp 97.1°F | Resp 12 | Ht 68.0 in | Wt 188.0 lb

## 2020-05-16 DIAGNOSIS — D124 Benign neoplasm of descending colon: Secondary | ICD-10-CM | POA: Diagnosis not present

## 2020-05-16 DIAGNOSIS — Z1211 Encounter for screening for malignant neoplasm of colon: Secondary | ICD-10-CM

## 2020-05-16 HISTORY — PX: COLONOSCOPY: SHX174

## 2020-05-16 MED ORDER — SODIUM CHLORIDE 0.9 % IV SOLN: 500.0000 mL | Freq: Once | INTRAVENOUS | Status: DC

## 2020-05-16 NOTE — Op Note (Signed)
Bryan Webb Patient Name: Bryan Webb Procedure Date: 05/16/2020 10:59 AM MRN: [id number] Endoscopist: Jerene Bears , MD Age: 49 Referring MD:  Date of Birth: 1971-02-10 Gender: Male Account #: [id number] Procedure:                Colonoscopy Indications:              Screening for colorectal malignant neoplasm, This                            is the patient's first colonoscopy Medicines:                Monitored Anesthesia Care Procedure:                Pre-Anesthesia Assessment:                           - Prior to the procedure, a History and Physical                            was performed, and patient medications and                            allergies were reviewed. The patient's tolerance of                            previous anesthesia was also reviewed. The risks                            and benefits of the procedure and the sedation                            options and risks were discussed with the patient.                            All questions were answered, and informed consent                            was obtained. Prior Anticoagulants: The patient has                            taken no previous anticoagulant or antiplatelet                            agents. ASA Grade Assessment: III - A patient with                            severe systemic disease. After reviewing the risks                            and benefits, the patient was deemed in                            satisfactory condition to undergo the procedure.  After obtaining informed consent, the colonoscope                            was passed under direct vision. Throughout the                            procedure, the patient's blood pressure, pulse, and                            oxygen saturations were monitored continuously. The                            Colonoscope was introduced through the anus and                            advanced to the cecum,  identified by appendiceal                            orifice and ileocecal valve. The colonoscopy was                            performed without difficulty. The patient tolerated                            the procedure well. The quality of the bowel                            preparation was good. The ileocecal valve,                            appendiceal orifice, and rectum were photographed. Scope In: 11:06:03 AM Scope Out: 07:37:10 AM Scope Withdrawal Time: 0 hours 7 minutes 17 seconds  Total Procedure Duration: 0 hours 10 minutes 16 seconds  Findings:                 The digital rectal exam was normal.                           A 4 mm polyp was found in the descending colon. The                            polyp was sessile. The polyp was removed with a                            cold snare. Resection and retrieval were complete.                           Multiple small-mouthed diverticula were found in                            the sigmoid colon, transverse colon and ascending                            colon.  Internal hemorrhoids were found during                            retroflexion. The hemorrhoids were small.                           The exam was otherwise without abnormality. Complications:            No immediate complications. Estimated Blood Loss:     Estimated blood loss was minimal. Impression:               - One 4 mm polyp in the descending colon, removed                            with a cold snare. Resected and retrieved.                           - Diverticulosis in the sigmoid colon, in the                            transverse colon and in the ascending colon.                           - Internal hemorrhoids.                           - The examination was otherwise normal. Recommendation:           - Patient has a contact number available for                            emergencies. The signs and symptoms of potential                             delayed complications were discussed with the                            patient. Return to normal activities tomorrow.                            Written discharge instructions were provided to the                            patient.                           - Resume previous diet.                           - Continue present medications.                           - Await pathology results.                           - Repeat colonoscopy is recommended. The  colonoscopy date will be determined after pathology                            results from today's exam become available for                            review. Jerene Bears, MD 05/16/2020 11:23:41 AM This report has been signed electronically.

## 2020-05-16 NOTE — Patient Instructions (Signed)
Discharge instructions given. Handouts on polyps,diverticulosis and hemorrhoids. Resume previous medications. YOU HAD AN ENDOSCOPIC PROCEDURE TODAY AT THE [facility]:   Refer to the procedure report that was given to you for any specific questions about what was found during the examination.  If the procedure report does not answer your questions, please call your gastroenterologist to clarify.  If you requested that your care partner not be given the details of your procedure findings, then the procedure report has been included in a sealed envelope for you to review at your convenience later.  YOU SHOULD EXPECT: Some feelings of bloating in the abdomen. Passage of more gas than usual.  Walking can help get rid of the air that was put into your GI tract during the procedure and reduce the bloating. If you had a lower endoscopy (such as a colonoscopy or flexible sigmoidoscopy) you may notice spotting of blood in your stool or on the toilet paper. If you underwent a bowel prep for your procedure, you may not have a normal bowel movement for a few days.  Please Note:  You might notice some irritation and congestion in your nose or some drainage.  This is from the oxygen used during your procedure.  There is no need for concern and it should clear up in a day or so.  SYMPTOMS TO REPORT IMMEDIATELY:   Following lower endoscopy (colonoscopy or flexible sigmoidoscopy):  Excessive amounts of blood in the stool  Significant tenderness or worsening of abdominal pains  Swelling of the abdomen that is new, acute  Fever of 100F or higher   For urgent or emergent issues, a gastroenterologist can be reached at any hour by calling [phone]. Do not use MyChart messaging for urgent concerns.    DIET:  We do recommend a small meal at first, but then you may proceed to your regular diet.  Drink plenty of fluids but you should avoid alcoholic beverages for 24 hours.  ACTIVITY:  You should  plan to take it easy for the rest of today and you should NOT DRIVE or use heavy machinery until tomorrow (because of the sedation medicines used during the test).    FOLLOW UP: Our staff will call the number listed on your records 48-72 hours following your procedure to check on you and address any questions or concerns that you may have regarding the information given to you following your procedure. If we do not reach you, we will leave a message.  We will attempt to reach you two times.  During this call, we will ask if you have developed any symptoms of COVID 19. If you develop any symptoms (ie: fever, flu-like symptoms, shortness of breath, cough etc.) before then, please call [phone].  If you test positive for Covid 19 in the 2 weeks post procedure, please call and report this information to us.    If any biopsies were taken you will be contacted by phone or by letter within the next 1-3 weeks.  Please call us at [phone] if you have not heard about the biopsies in 3 weeks.    SIGNATURES/CONFIDENTIALITY: You and/or your care partner have signed paperwork which will be entered into your electronic medical record.  These signatures attest to the fact that that the information above on your After Visit Summary has been reviewed and is understood.  Full responsibility of the confidentiality of this discharge information lies with you and/or your care-partner. 

## 2020-05-16 NOTE — Progress Notes (Signed)
A and O x3. Report to RN. Tolerated MAC anesthesia well.

## 2020-05-16 NOTE — Progress Notes (Signed)
Pt's states no medical or surgical changes since previsit or office visit.  ° °Vitals CW °

## 2020-05-18 ENCOUNTER — Telehealth: Payer: Self-pay

## 2020-05-18 NOTE — Telephone Encounter (Signed)
  Follow up Call-  Call back number 05/16/2020  Post procedure Call Back phone  # [phone]  Permission to leave phone message Yes  Some recent data might be hidden     Patient questions:  Do you have a fever, pain , or abdominal swelling? No. Pain Score  0 *  Have you tolerated food without any problems? Yes.    Have you been able to return to your normal activities? Yes.    Do you have any questions about your discharge instructions: Diet   No. Medications  No. Follow up visit  No.  Do you have questions or concerns about your Care? No.  Actions: * If pain score is 4 or above: No action needed, pain <4. 1. Have you developed a fever since your procedure? no  2.   Have you had an respiratory symptoms (SOB or cough) since your procedure? no  3.   Have you tested positive for COVID 19 since your procedure no  4.   Have you had any family members/close contacts diagnosed with the COVID 19 since your procedure?  no   If yes to any of these questions please route to Joylene John, RN and Joella Prince, RN

## 2020-05-19 ENCOUNTER — Encounter: Payer: Self-pay | Admitting: Internal Medicine

## 2020-05-20 DIAGNOSIS — G4733 Obstructive sleep apnea (adult) (pediatric): Secondary | ICD-10-CM | POA: Diagnosis not present

## 2020-06-15 ENCOUNTER — Other Ambulatory Visit: Payer: Self-pay | Admitting: Endocrinology

## 2020-07-03 ENCOUNTER — Other Ambulatory Visit: Payer: BC Managed Care – PPO

## 2020-07-06 ENCOUNTER — Ambulatory Visit: Payer: BC Managed Care – PPO | Admitting: Endocrinology

## 2020-07-20 DIAGNOSIS — E119 Type 2 diabetes mellitus without complications: Secondary | ICD-10-CM | POA: Diagnosis not present

## 2020-07-20 LAB — HM DIABETES EYE EXAM

## 2020-07-24 ENCOUNTER — Other Ambulatory Visit: Payer: BC Managed Care – PPO

## 2020-07-31 ENCOUNTER — Encounter: Payer: Self-pay | Admitting: *Deleted

## 2020-07-31 ENCOUNTER — Other Ambulatory Visit: Payer: Self-pay | Admitting: Endocrinology

## 2020-08-01 ENCOUNTER — Ambulatory Visit: Payer: BC Managed Care – PPO | Admitting: Endocrinology

## 2020-08-02 DIAGNOSIS — I129 Hypertensive chronic kidney disease with stage 1 through stage 4 chronic kidney disease, or unspecified chronic kidney disease: Secondary | ICD-10-CM | POA: Diagnosis not present

## 2020-08-02 DIAGNOSIS — N1832 Chronic kidney disease, stage 3b: Secondary | ICD-10-CM | POA: Diagnosis not present

## 2020-08-02 DIAGNOSIS — R809 Proteinuria, unspecified: Secondary | ICD-10-CM | POA: Diagnosis not present

## 2020-08-02 DIAGNOSIS — E559 Vitamin D deficiency, unspecified: Secondary | ICD-10-CM | POA: Diagnosis not present

## 2020-08-02 DIAGNOSIS — N179 Acute kidney failure, unspecified: Secondary | ICD-10-CM | POA: Diagnosis not present

## 2020-08-09 ENCOUNTER — Other Ambulatory Visit: Payer: BC Managed Care – PPO

## 2020-08-15 ENCOUNTER — Ambulatory Visit: Payer: BC Managed Care – PPO | Admitting: Endocrinology

## 2020-08-24 ENCOUNTER — Other Ambulatory Visit: Payer: Self-pay

## 2020-08-24 ENCOUNTER — Other Ambulatory Visit (INDEPENDENT_AMBULATORY_CARE_PROVIDER_SITE_OTHER): Payer: BC Managed Care – PPO

## 2020-08-24 DIAGNOSIS — E119 Type 2 diabetes mellitus without complications: Secondary | ICD-10-CM

## 2020-08-24 DIAGNOSIS — E782 Mixed hyperlipidemia: Secondary | ICD-10-CM | POA: Diagnosis not present

## 2020-08-24 DIAGNOSIS — Z794 Long term (current) use of insulin: Secondary | ICD-10-CM | POA: Diagnosis not present

## 2020-08-24 LAB — COMPREHENSIVE METABOLIC PANEL
ALT: 48 U/L (ref 0–53)
AST: 42 U/L — ABNORMAL HIGH (ref 0–37)
Albumin: 4.2 g/dL (ref 3.5–5.2)
Alkaline Phosphatase: 145 U/L — ABNORMAL HIGH (ref 39–117)
BUN: 34 mg/dL — ABNORMAL HIGH (ref 6–23)
CO2: 26 mEq/L (ref 19–32)
Calcium: 9.1 mg/dL (ref 8.4–10.5)
Chloride: 103 mEq/L (ref 96–112)
Creatinine, Ser: 2.17 mg/dL — ABNORMAL HIGH (ref 0.40–1.50)
GFR: 34.53 mL/min — ABNORMAL LOW (ref >60.00)
Glucose, Bld: 121 mg/dL — ABNORMAL HIGH (ref 70–99)
Potassium: 4.5 mEq/L (ref 3.5–5.1)
Sodium: 136 mEq/L (ref 135–145)
Total Bilirubin: 0.6 mg/dL (ref 0.2–1.2)
Total Protein: 8.1 g/dL (ref 6.0–8.3)

## 2020-08-24 LAB — LIPID PANEL
Cholesterol: 271 mg/dL — ABNORMAL HIGH (ref 0–200)
HDL: 34.4 mg/dL — ABNORMAL LOW (ref >39.00)
NonHDL: 236.62
Total CHOL/HDL Ratio: 8
Triglycerides: 264 mg/dL — ABNORMAL HIGH (ref 0.0–149.0)
VLDL: 52.8 mg/dL — ABNORMAL HIGH (ref 0.0–40.0)

## 2020-08-24 LAB — MICROALBUMIN / CREATININE URINE RATIO
Creatinine,U: 176.6 mg/dL
Microalb Creat Ratio: 10 mg/g (ref 0.0–30.0)
Microalb, Ur: 17.7 mg/dL — ABNORMAL HIGH (ref 0.0–1.9)

## 2020-08-24 LAB — HEMOGLOBIN A1C: Hgb A1c MFr Bld: 7.7 % — ABNORMAL HIGH (ref 4.6–6.5)

## 2020-08-25 LAB — LDL CHOLESTEROL, DIRECT: Direct LDL: 180 mg/dL

## 2020-08-28 ENCOUNTER — Ambulatory Visit: Payer: BC Managed Care – PPO | Admitting: Endocrinology

## 2020-08-28 ENCOUNTER — Encounter: Payer: Self-pay | Admitting: Endocrinology

## 2020-08-28 ENCOUNTER — Other Ambulatory Visit: Payer: Self-pay

## 2020-08-28 VITALS — BP 130/82 | HR 62 | Ht 68.0 in | Wt 200.4 lb

## 2020-08-28 DIAGNOSIS — E782 Mixed hyperlipidemia: Secondary | ICD-10-CM

## 2020-08-28 DIAGNOSIS — E1165 Type 2 diabetes mellitus with hyperglycemia: Secondary | ICD-10-CM | POA: Diagnosis not present

## 2020-08-28 MED ORDER — ROSUVASTATIN CALCIUM 20 MG PO TABS: 20.0000 mg | ORAL_TABLET | Freq: Every day | ORAL | 1 refills | Status: DC

## 2020-08-28 MED ORDER — RYBELSUS 7 MG PO TABS: ORAL_TABLET | ORAL | 1 refills | Status: DC

## 2020-08-28 NOTE — Progress Notes (Signed)
Patient ID: Bryan Webb, male   DOB: 03/06/1971, 49 y.o.   MRN: [id number]           Reason for Appointment: Follow-up    History of Present Illness:          Date of diagnosis of type 2 diabetes mellitus: 07/2016       Background history:   He was initially diagnosed at an emergency room when he presented with high blood sugars He was initially given Lantus insulin but he was switched to Trulicity by his PCP He says he had better blood sugars with Trulicity and his J8S had gone down to 7.4 However not clear why he was put back on insulin after about 3 months and also Metformin He could not tolerate more than 1 tablet of 500 mg metformin ER  Recent history:   Most recent A1c is now 7.7 Fructosamine is 311, was at 321  Non-insulin hypoglycemic drugs the patient is taking are: Rybelsus 7 mg daily hs, Prandin 0.5 mg at dinner  Current management, blood sugar patterns and problems identified:  He did not bring his meter again  However he again is likely checking blood sugars only sporadically in the mornings and not any other time  He was told to take Prandin at dinnertime and also if eating a higher carbohydrate meal at lunch  With this it is unclear how much his blood sugars are after dinner as he does not monitor  He says he has been probably eating larger portions and has gained weight  He has started drinking juices, usually in the evenings and also some orange juice in the morning  Lab glucose was 121 after lunch, he thinks he had a sandwich that day at lunch  He thinks he is fairly active during the day  Has taken his Rybelsus before eating regularly every morning       Side effects from medications have been: Nausea from 14 mg Rybelsus     Typical meal intake: Breakfast is variable, usually 3 meals a day            Exercise: walks at work    Glucose monitoring:  done less than 1 times a day         Glucometer:   Contour       Blood Glucose readings in the  morning recently 110-120 Not checking after meals, once 145   Dietician visit, most recent: 12/2018  Weight history: Maximum 240  Wt Readings from Last 3 Encounters:  08/28/20 200 lb 6.4 oz (90.9 kg)  05/16/20 188 lb (85.3 kg)  05/02/20 188 lb (85.3 kg)    Glycemic control:   Lab Results  Component Value Date   HGBA1C 7.7 (H) 08/24/2020   HGBA1C 7.8 (H) 03/29/2020   HGBA1C 7.7 (H) 11/23/2019   Lab Results  Component Value Date   MICROALBUR 17.7 (H) 08/24/2020   LDLCALC 76 11/23/2019   CREATININE 2.17 (H) 08/24/2020   Lab Results  Component Value Date   MICRALBCREAT 10.0 08/24/2020    Lab Results  Component Value Date   FRUCTOSAMINE 311 (H) 03/29/2020   FRUCTOSAMINE 321 (H) 11/23/2019   FRUCTOSAMINE 299 (H) 05/18/2019    Lab on 08/24/2020  Component Date Value Ref Range Status   Microalb, Ur 08/24/2020 17.7* 0.0 - 1.9 mg/dL Final   Creatinine,U 08/24/2020 176.6  mg/dL Final   Microalb Creat Ratio 08/24/2020 10.0  0.0 - 30.0 mg/g Final   Cholesterol 08/24/2020 271* 0 -  200 mg/dL Final   ATP III Classification       Desirable:  < 200 mg/dL               Borderline High:  200 - 239 mg/dL          High:  > = 240 mg/dL   Triglycerides 08/24/2020 264.0* 0.0 - 149.0 mg/dL Final   Normal:  <150 mg/dLBorderline High:  150 - 199 mg/dL   HDL 08/24/2020 34.40* >39.00 mg/dL Final   VLDL 08/24/2020 52.8* 0.0 - 40.0 mg/dL Final   Total CHOL/HDL Ratio 08/24/2020 8   Final                  Men          Women1/2 Average Risk     3.4          3.3Average Risk          5.0          4.42X Average Risk          9.6          7.13X Average Risk          15.0          11.0                       NonHDL 08/24/2020 236.62   Final   NOTE:  Non-HDL goal should be 30 mg/dL higher than patient's LDL goal (i.e. LDL goal of < 70 mg/dL, would have non-HDL goal of < 100 mg/dL)   Sodium 08/24/2020 136  135 - 145 mEq/L Final   Potassium 08/24/2020 4.5  3.5 - 5.1 mEq/L Final   Chloride  08/24/2020 103  96 - 112 mEq/L Final   CO2 08/24/2020 26  19 - 32 mEq/L Final   Glucose, Bld 08/24/2020 121* 70 - 99 mg/dL Final   BUN 08/24/2020 34* 6 - 23 mg/dL Final   Creatinine, Ser 08/24/2020 2.17* 0.40 - 1.50 mg/dL Final   Total Bilirubin 08/24/2020 0.6  0.2 - 1.2 mg/dL Final   Alkaline Phosphatase 08/24/2020 145* 39 - 117 U/L Final   AST 08/24/2020 42* 0 - 37 U/L Final   ALT 08/24/2020 48  0 - 53 U/L Final   Total Protein 08/24/2020 8.1  6.0 - 8.3 g/dL Final   Albumin 08/24/2020 4.2  3.5 - 5.2 g/dL Final   GFR 08/24/2020 34.53* >60.00 mL/min Final   Calculated using the CKD-EPI Creatinine Equation (2021)   Calcium 08/24/2020 9.1  8.4 - 10.5 mg/dL Final   Hgb A1c MFr Bld 08/24/2020 7.7* 4.6 - 6.5 % Final   Glycemic Control Guidelines for People with Diabetes:Non Diabetic:  <6%Goal of Therapy: <7%Additional Action Suggested:  >8%    Direct LDL 08/24/2020 180.0  mg/dL Final   Optimal:  <100 mg/dLNear or Above Optimal:  100-129 mg/dLBorderline High:  130-159 mg/dLHigh:  160-189 mg/dLVery High:  >190 mg/dL    Allergies as of 08/28/2020   No Known Allergies     Medication List       Accurate as of August 28, 2020  4:11 PM. If you have any questions, ask your nurse or doctor.        amLODipine 10 MG tablet Commonly known as: NORVASC Take 1 tablet (10 mg total) by mouth daily.   cloNIDine 0.1 MG tablet Commonly known as: CATAPRES Take 0.1 mg by mouth daily.   ezetimibe 10 MG tablet Commonly  known as: Zetia Take 1 tablet (10 mg total) by mouth daily.   Garlic 3810 MG Caps Take by mouth.   losartan 100 MG tablet Commonly known as: COZAAR Take 1 tablet (100 mg total) by mouth daily.   meloxicam 15 MG tablet Commonly known as: MOBIC Take 1 tablet (15 mg total) by mouth daily.   methocarbamol 500 MG tablet Commonly known as: Robaxin Take 1 tablet (500 mg total) by mouth every 8 (eight) hours as needed for muscle spasms.   metoprolol succinate 100  MG 24 hr tablet Commonly known as: TOPROL-XL Take 1 tablet (100 mg total) by mouth daily.   OVER THE COUNTER MEDICATION Korean Ginseng over the counter 500 mg daily   repaglinide 0.5 MG tablet Commonly known as: PRANDIN Take 1 tablet (0.5 mg total) by mouth daily before supper.   rosuvastatin 20 MG tablet Commonly known as: Crestor Take 1 tablet (20 mg total) by mouth daily.   Rybelsus 7 MG Tabs Generic drug: Semaglutide TAKE 1 TABLET BY MOUTH ONCE DAILY 30  MINUTES  BEFORE  BREAKFAST  WITH  WATER   sildenafil 100 MG tablet Commonly known as: VIAGRA Take 50 mg by mouth daily as needed.       Allergies: No Known Allergies  Past Medical History:  Diagnosis Date   Arthritis    knee left    Chicken pox    Chronic kidney disease    Diabetes mellitus without complication (HCC)    GERD (gastroesophageal reflux disease)    mild    Hyperlipidemia    Hypertension    Sleep apnea    has cpap- weras occ only     Past Surgical History:  Procedure Laterality Date   COLONOSCOPY  05/16/2020   DENTAL SURGERY     with sedation     Family History  Problem Relation Age of Onset   Hypertension Mother    Hypertension Father    Colon polyps Father    Hypertension Maternal Grandmother    Hypertension Maternal Grandfather    Hypertension Paternal Grandmother    Diabetes Paternal Grandmother    Hypertension Paternal Grandfather    Colon cancer Neg Hx    Esophageal cancer Neg Hx    Rectal cancer Neg Hx    Stomach cancer Neg Hx     Social History:  reports that he has been smoking. He has a 5.00 pack-year smoking history. He has never used smokeless tobacco. He reports current alcohol use. He reports that he does not use drugs.   Review of Systems   Lipid history: He was started on Crestor 20 mg when he was not tolerating Lipitor  He recently has not taken his Crestor and is only on Zetia Previously LDL with the combination is below 100, but now LDL  is markedly increased  Triglycerides are high, was nonfasting  Lab Results  Component Value Date   CHOL 271 (H) 08/24/2020   CHOL 141 11/23/2019   CHOL 232 (H) 08/23/2019   Lab Results  Component Value Date   HDL 34.40 (L) 08/24/2020   HDL 35.00 (L) 11/23/2019   HDL 35.60 (L) 08/23/2019   Lab Results  Component Value Date   LDLCALC 76 11/23/2019   LDLCALC 131 (H) 05/18/2019   LDLCALC 111 (H) 02/01/2019   Lab Results  Component Value Date   TRIG 264.0 (H) 08/24/2020   TRIG 150.0 (H) 11/23/2019   TRIG 263.0 (H) 08/23/2019   Lab Results  Component Value Date  CHOLHDL 8 08/24/2020   CHOLHDL 4 11/23/2019   CHOLHDL 7 08/23/2019   Lab Results  Component Value Date   LDLDIRECT 180.0 08/24/2020   LDLDIRECT 158.0 08/23/2019   LDLDIRECT 178.0 09/21/2018            Hypertension: Has been treated with losartan 100 mg, clonidine 0.1, metoprolol 100 mg and amlodipine 10 mg daily On treatment for several years  Home BP is checked regularly, followed by nephrologist    BP Readings from Last 3 Encounters:  08/28/20 130/82  05/16/20 114/75  04/03/20 130/80    Most recent eye exam was in 5/20  Most recent foot exam: 10/2018  Currently known complications of diabetes: Erectile dysfunction  Chronic kidney disease: Values as below, also seen by nephrologist His kidney doctor is asking about starting Farxiga but no records available  Lab Results  Component Value Date   CREATININE 2.17 (H) 08/24/2020   CREATININE 2.65 (H) 03/29/2020   CREATININE 2.31 (H) 11/23/2019   Reportedly has fatty liver with persistently high liver functions, slightly better than before  Lab Results  Component Value Date   ALT 48 08/24/2020     LABS:  Lab on 08/24/2020  Component Date Value Ref Range Status   Microalb, Ur 08/24/2020 17.7* 0.0 - 1.9 mg/dL Final   Creatinine,U 08/24/2020 176.6  mg/dL Final   Microalb Creat Ratio 08/24/2020 10.0  0.0 - 30.0 mg/g Final   Cholesterol  08/24/2020 271* 0 - 200 mg/dL Final   ATP III Classification       Desirable:  < 200 mg/dL               Borderline High:  200 - 239 mg/dL          High:  > = 240 mg/dL   Triglycerides 08/24/2020 264.0* 0.0 - 149.0 mg/dL Final   Normal:  <150 mg/dLBorderline High:  150 - 199 mg/dL   HDL 08/24/2020 34.40* >39.00 mg/dL Final   VLDL 08/24/2020 52.8* 0.0 - 40.0 mg/dL Final   Total CHOL/HDL Ratio 08/24/2020 8   Final                  Men          Women1/2 Average Risk     3.4          3.3Average Risk          5.0          4.42X Average Risk          9.6          7.13X Average Risk          15.0          11.0                       NonHDL 08/24/2020 236.62   Final   NOTE:  Non-HDL goal should be 30 mg/dL higher than patient's LDL goal (i.e. LDL goal of < 70 mg/dL, would have non-HDL goal of < 100 mg/dL)   Sodium 08/24/2020 136  135 - 145 mEq/L Final   Potassium 08/24/2020 4.5  3.5 - 5.1 mEq/L Final   Chloride 08/24/2020 103  96 - 112 mEq/L Final   CO2 08/24/2020 26  19 - 32 mEq/L Final   Glucose, Bld 08/24/2020 121* 70 - 99 mg/dL Final   BUN 08/24/2020 34* 6 - 23 mg/dL Final   Creatinine, Ser 08/24/2020 2.17* 0.40 - 1.50 mg/dL  Final   Total Bilirubin 08/24/2020 0.6  0.2 - 1.2 mg/dL Final   Alkaline Phosphatase 08/24/2020 145* 39 - 117 U/L Final   AST 08/24/2020 42* 0 - 37 U/L Final   ALT 08/24/2020 48  0 - 53 U/L Final   Total Protein 08/24/2020 8.1  6.0 - 8.3 g/dL Final   Albumin 08/24/2020 4.2  3.5 - 5.2 g/dL Final   GFR 08/24/2020 34.53* >60.00 mL/min Final   Calculated using the CKD-EPI Creatinine Equation (2021)   Calcium 08/24/2020 9.1  8.4 - 10.5 mg/dL Final   Hgb A1c MFr Bld 08/24/2020 7.7* 4.6 - 6.5 % Final   Glycemic Control Guidelines for People with Diabetes:Non Diabetic:  <6%Goal of Therapy: <7%Additional Action Suggested:  >8%    Direct LDL 08/24/2020 180.0  mg/dL Final   Optimal:  <100 mg/dLNear or Above Optimal:  100-129 mg/dLBorderline High:  130-159  mg/dLHigh:  160-189 mg/dLVery High:  >190 mg/dL    Physical Examination:  BP 130/82    Pulse 62    Ht 5\' 8"  (1.727 m)    Wt 200 lb 6.4 oz (90.9 kg)    SpO2 99%    BMI 30.47 kg/m       ASSESSMENT:  Diabetes type 2, and obesity  See history of present illness for detailed discussion of current diabetes management, blood sugar patterns and problems identified  His A1c is unchanged at 7.7   He is taking Prandin at dinnertime and unlikely this is helping his sugars since he is drinking a lot of juices in the evening Also likely getting more calories and carbohydrates with his recent weight gain He previously did not tolerate more than 7 mg Rybelsus Also he thinks he may not be consistent with it as his pharmacy did not refill this 1 time  He forgets to check his sugars after meals and does not bring her monitor for download also  Hypercholesterolemia: He has marked increase in cholesterol from not refilling his Crestor  HYPERTENSION: Blood pressure is again good, to continue follow-up with nephrologist  Mild increase in liver functions: Improved  PLAN:    Discussed importance of improving diet, cutting back on juices and trying flavored water instead He must check his sugars at night after dinner at least every other day May need a higher dose of Prandin at dinnertime He should also check sugars after breakfast on weekends Okay to start Farxiga from nephrologist Restart Crestor, 90-day prescription sent     Patient Instructions  Check blood sugars on waking up 2-3 days a week  Also check blood sugars about 2 hours after meals and do this after different meals by rotation  Recommended blood sugar levels on waking up are 90-130 and about 2 hours after meal is 130-160  Please bring your blood sugar monitor to each visit, thank you         Elayne Snare 08/28/2020, 4:11 PM   Note: This office note was prepared with Dragon voice recognition system technology. Any  transcriptional errors that result from this process are unintentional.

## 2020-08-28 NOTE — Patient Instructions (Addendum)
Check blood sugars on waking up 2-3 days a week  Also check blood sugars about 2 hours after meals and do this after different meals by rotation  Recommended blood sugar levels on waking up are 90-130 and about 2 hours after meal is 130-160  Please bring your blood sugar monitor to each visit, thank you   

## 2020-09-06 ENCOUNTER — Other Ambulatory Visit: Payer: Self-pay

## 2020-09-06 ENCOUNTER — Encounter: Payer: Self-pay | Admitting: Family

## 2020-09-06 ENCOUNTER — Ambulatory Visit (INDEPENDENT_AMBULATORY_CARE_PROVIDER_SITE_OTHER): Payer: BC Managed Care – PPO | Admitting: Family

## 2020-09-06 VITALS — BP 138/76 | HR 66 | Temp 98.2°F | Ht 68.0 in | Wt 198.0 lb

## 2020-09-06 DIAGNOSIS — Z794 Long term (current) use of insulin: Secondary | ICD-10-CM

## 2020-09-06 DIAGNOSIS — N529 Male erectile dysfunction, unspecified: Secondary | ICD-10-CM

## 2020-09-06 DIAGNOSIS — I10 Essential (primary) hypertension: Secondary | ICD-10-CM | POA: Diagnosis not present

## 2020-09-06 DIAGNOSIS — N184 Chronic kidney disease, stage 4 (severe): Secondary | ICD-10-CM

## 2020-09-06 DIAGNOSIS — E119 Type 2 diabetes mellitus without complications: Secondary | ICD-10-CM | POA: Diagnosis not present

## 2020-09-06 DIAGNOSIS — Z720 Tobacco use: Secondary | ICD-10-CM | POA: Diagnosis not present

## 2020-09-06 MED ORDER — LOSARTAN POTASSIUM 100 MG PO TABS: 100.0000 mg | ORAL_TABLET | Freq: Every day | ORAL | 3 refills | Status: DC

## 2020-09-06 MED ORDER — SILDENAFIL CITRATE 100 MG PO TABS: 50.0000 mg | ORAL_TABLET | Freq: Every day | ORAL | 3 refills | Status: DC | PRN

## 2020-09-06 MED ORDER — AMLODIPINE BESYLATE 10 MG PO TABS: 10.0000 mg | ORAL_TABLET | Freq: Every day | ORAL | 3 refills | Status: DC

## 2020-09-06 MED ORDER — METOPROLOL SUCCINATE ER 100 MG PO TB24: 100.0000 mg | ORAL_TABLET | Freq: Every day | ORAL | 3 refills | Status: DC

## 2020-09-06 NOTE — Progress Notes (Signed)
Bryan Webb is a 49 y.o. male with the following history as recorded in [employer]:  Patient Active Problem List   Diagnosis Date Noted  . Retrognathia 06/28/2019  . Non-restorative sleep 06/28/2019  . Snoring 06/28/2019  . Vasculogenic erectile dysfunction 06/28/2019  . Concern about STD in male without diagnosis 04/17/2017  . Essential hypertension 01/03/2017  . Type 2 diabetes mellitus ([place]) 08/21/2016    Current Outpatient Medications  Medication Sig Dispense Refill  . cloNIDine (CATAPRES) 0.1 MG tablet Take 0.1 mg by mouth daily.    Marland Kitchen ezetimibe (ZETIA) 10 MG tablet Take 1 tablet (10 mg total) by mouth daily. 30 tablet 3  . Garlic 6283 MG CAPS Take by mouth.    Marland Kitchen OVER THE COUNTER MEDICATION Korean Ginseng over the counter 500 mg daily    . repaglinide (PRANDIN) 0.5 MG tablet Take 1 tablet (0.5 mg total) by mouth daily before supper. 30 tablet 2  . rosuvastatin (CRESTOR) 20 MG tablet Take 1 tablet (20 mg total) by mouth daily. 90 tablet 1  . Semaglutide (RYBELSUS) 7 MG TABS TAKE 1 TABLET BY MOUTH ONCE DAILY 30  MINUTES  BEFORE  BREAKFAST  WITH  WATER 90 tablet 1  . amLODipine (NORVASC) 10 MG tablet Take 1 tablet (10 mg total) by mouth daily. 90 tablet 3  . losartan (COZAAR) 100 MG tablet Take 1 tablet (100 mg total) by mouth daily. 90 tablet 3  . metoprolol succinate (TOPROL-XL) 100 MG 24 hr tablet Take 1 tablet (100 mg total) by mouth daily. 90 tablet 3  . sildenafil (VIAGRA) 100 MG tablet Take 0.5 tablets (50 mg total) by mouth daily as needed. 10 tablet 3   No current facility-administered medications for this visit.    Allergies: Patient has no known allergies.  Past Medical History:  Diagnosis Date  . Arthritis    knee left   . Chicken pox   . Chronic kidney disease   . Diabetes mellitus without complication ([place])   . GERD (gastroesophageal reflux disease)    mild   . Hyperlipidemia   . Hypertension   . Sleep apnea    has cpap- weras occ only     Past Surgical  History:  Procedure Laterality Date  . COLONOSCOPY  05/16/2020  . DENTAL SURGERY     with sedation     Family History  Problem Relation Age of Onset  . Hypertension Mother   . Hypertension Father   . Colon polyps Father   . Hypertension Maternal Grandmother   . Hypertension Maternal Grandfather   . Hypertension Paternal Grandmother   . Diabetes Paternal Grandmother   . Hypertension Paternal Grandfather   . Colon cancer Neg Hx   . Esophageal cancer Neg Hx   . Rectal cancer Neg Hx   . Stomach cancer Neg Hx     Social History   Tobacco Use  . Smoking status: Current Every Day Smoker    Packs/day: 0.25    Years: 20.00    Pack years: 5.00  . Smokeless tobacco: Never Used  . Tobacco comment: considering quitting completely in the next year  Substance Use Topics  . Alcohol use: Yes    Comment: social on specila occasions ONLY    Subjective:  1 year follow up on hypertension; Continuing to see endocrine for diabetes management every 3 months and nephrology every 4 months; Had labs done with employer in October- notes that PSA was checked at that time and does not need today;  Denies any chest pain, shortness of breath, blurred vision or headache    Objective:  Vitals:   09/06/20 1159  BP: 138/76  Pulse: 66  Temp: 98.2 F (36.8 C)  TempSrc: Oral  SpO2: 99%  Weight: 198 lb (89.8 kg)  Height: 5\' 8"  (1.727 m)    General: Well developed, well nourished, in no acute distress  Skin : Warm and dry.  Head: Normocephalic and atraumatic  Eyes: Sclera and conjunctiva clear; pupils round and reactive to light; extraocular movements intact  Ears: External normal; canals clear; tympanic membranes normal  Oropharynx: Pink, supple. No suspicious lesions  Neck: Supple without thyromegaly, adenopathy  Lungs: Respirations unlabored; clear to auscultation bilaterally without wheeze, rales, rhonchi  CVS exam: normal rate and regular rhythm.  Neurologic: Alert and oriented; speech  intact; face symmetrical; moves all extremities well; CNII-XII intact without focal deficit   Assessment:  1. Essential hypertension   2. Erectile dysfunction, unspecified erectile dysfunction type   3. Tobacco abuse   4. Type 2 diabetes mellitus without complication, with long-term current use of insulin ([place])   5. CKD (chronic kidney disease) stage 4, GFR 15-29 ml/min (HCC)     Plan:  1. Stable; refills updated; 2. Refill updated; per patient, PSA was recently checked at employer CPE; 3. Stressed need to quit smoking;  4. Continue with endocrine; 5. Continue with nephrology; Follow-up in 1 year, sooner prn.  This visit occurred during the SARS-CoV-2 public health emergency.  Safety protocols were in place, including screening questions prior to the visit, additional usage of staff PPE, and extensive cleaning of exam room while observing appropriate contact time as indicated for disinfecting solutions.      No follow-ups on file.  No orders of the defined types were placed in this encounter.   Requested Prescriptions   Signed Prescriptions Disp Refills  . amLODipine (NORVASC) 10 MG tablet 90 tablet 3    Sig: Take 1 tablet (10 mg total) by mouth daily.  Marland Kitchen losartan (COZAAR) 100 MG tablet 90 tablet 3    Sig: Take 1 tablet (100 mg total) by mouth daily.  . metoprolol succinate (TOPROL-XL) 100 MG 24 hr tablet 90 tablet 3    Sig: Take 1 tablet (100 mg total) by mouth daily.  . sildenafil (VIAGRA) 100 MG tablet 10 tablet 3    Sig: Take 0.5 tablets (50 mg total) by mouth daily as needed.

## 2020-11-23 ENCOUNTER — Other Ambulatory Visit: Payer: BC Managed Care – PPO

## 2020-11-24 ENCOUNTER — Other Ambulatory Visit: Payer: BC Managed Care – PPO

## 2020-11-28 ENCOUNTER — Ambulatory Visit: Payer: BC Managed Care – PPO | Admitting: Endocrinology

## 2020-12-02 IMAGING — CR LEFT ANKLE COMPLETE - 3+ VIEW
3 series · 3 of 3 positions shown · non-contrast
Comparison: None.

CLINICAL DATA: Twisting injury today with pain, initial encounter

EXAM:
LEFT ANKLE COMPLETE - 3+ VIEW

[x ankle ap left]
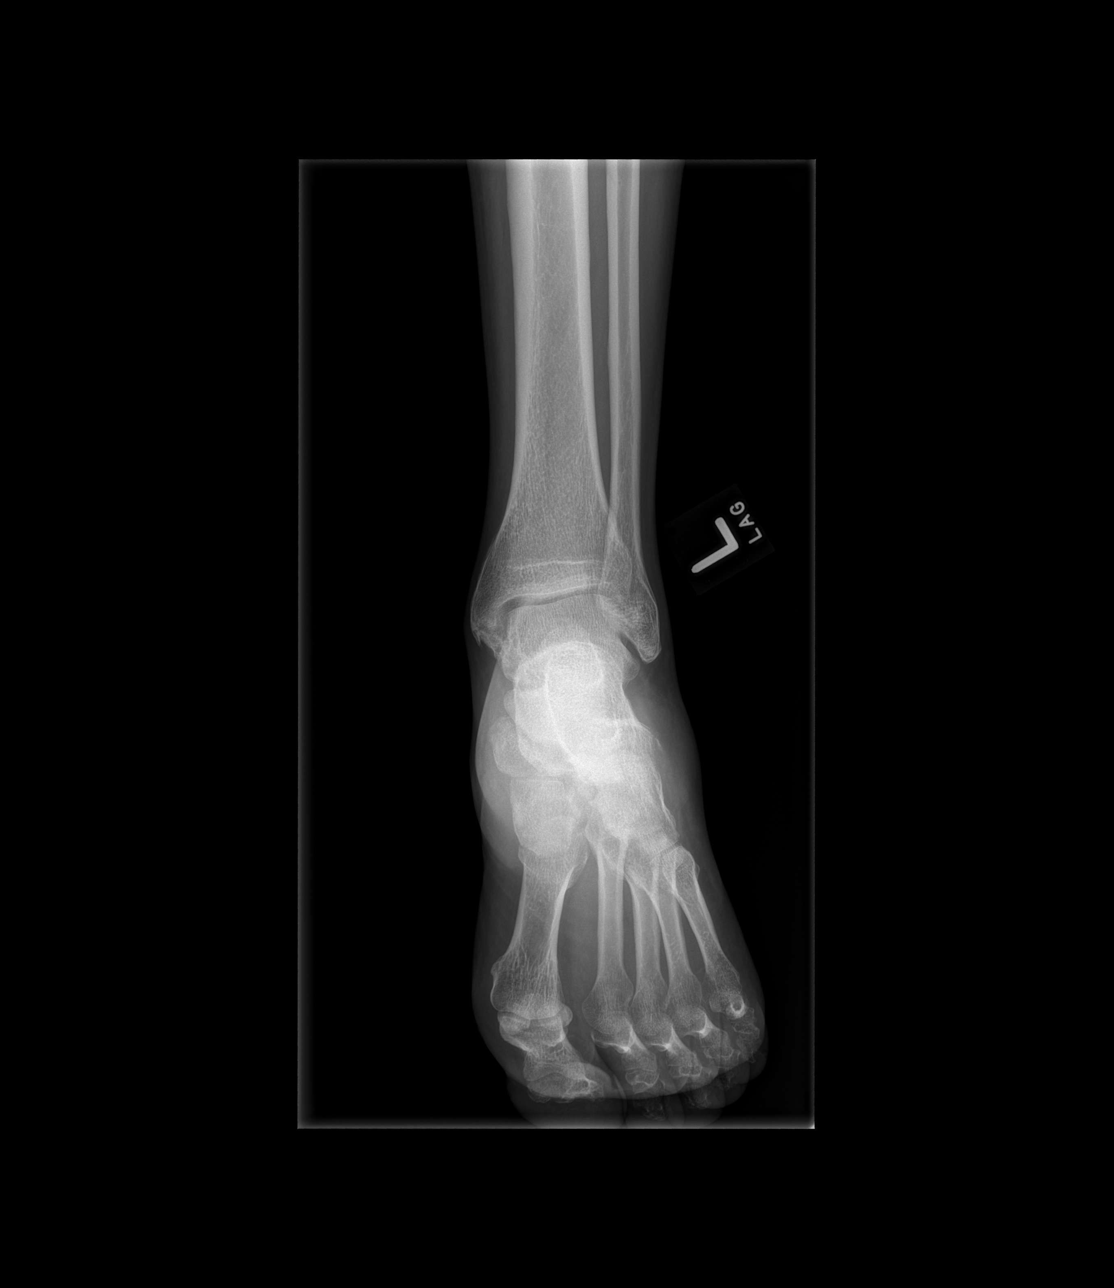

[x ankle obl left]
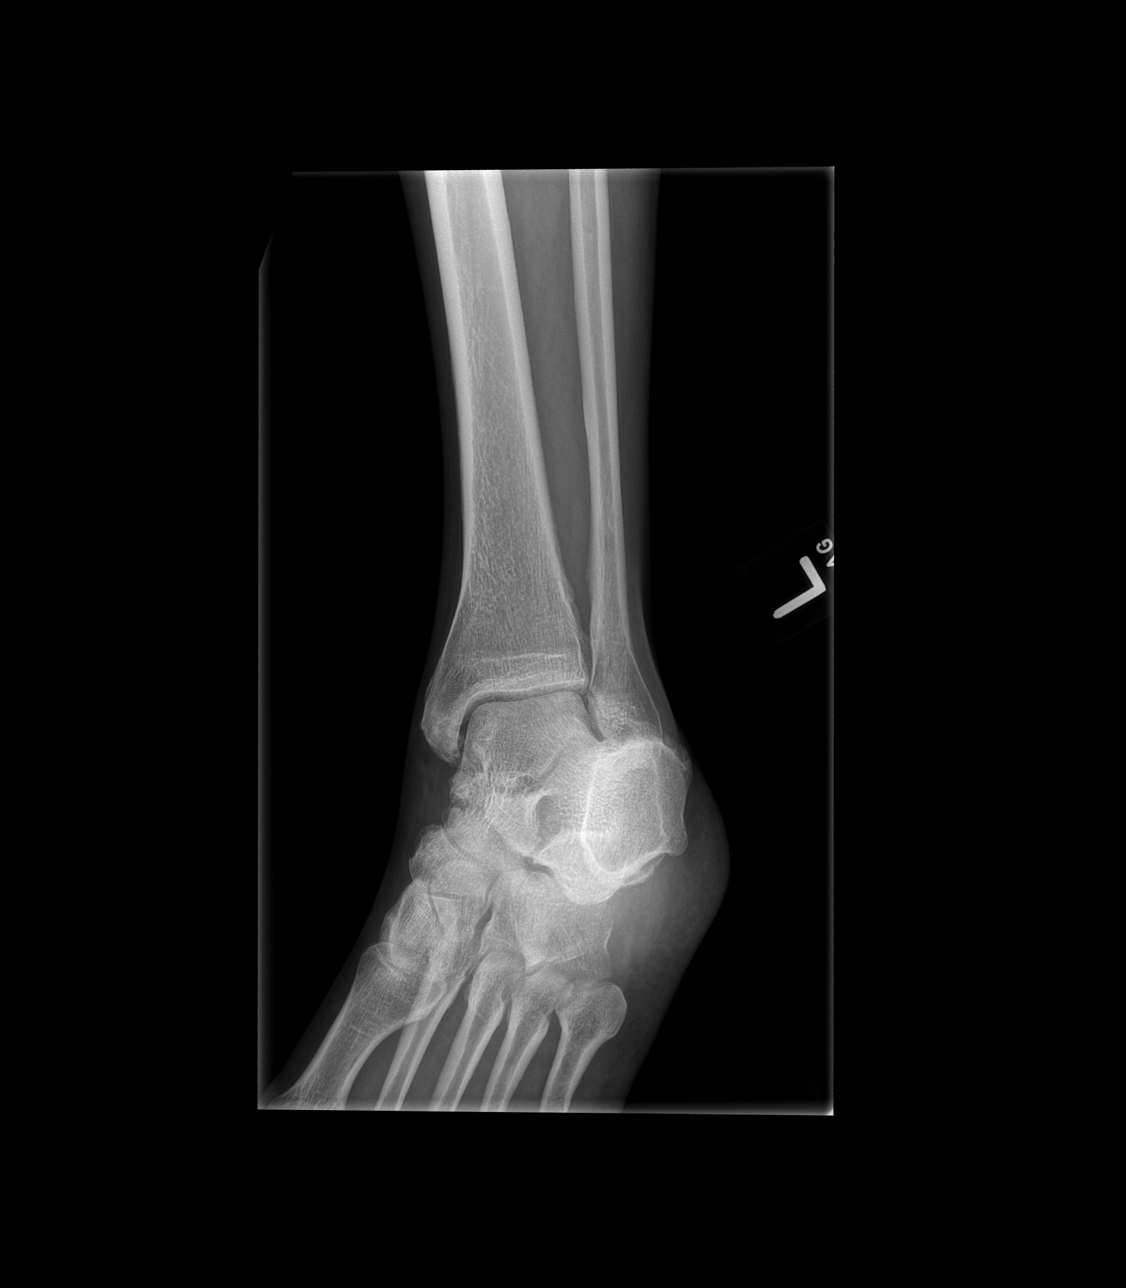

[x ankle lat left]
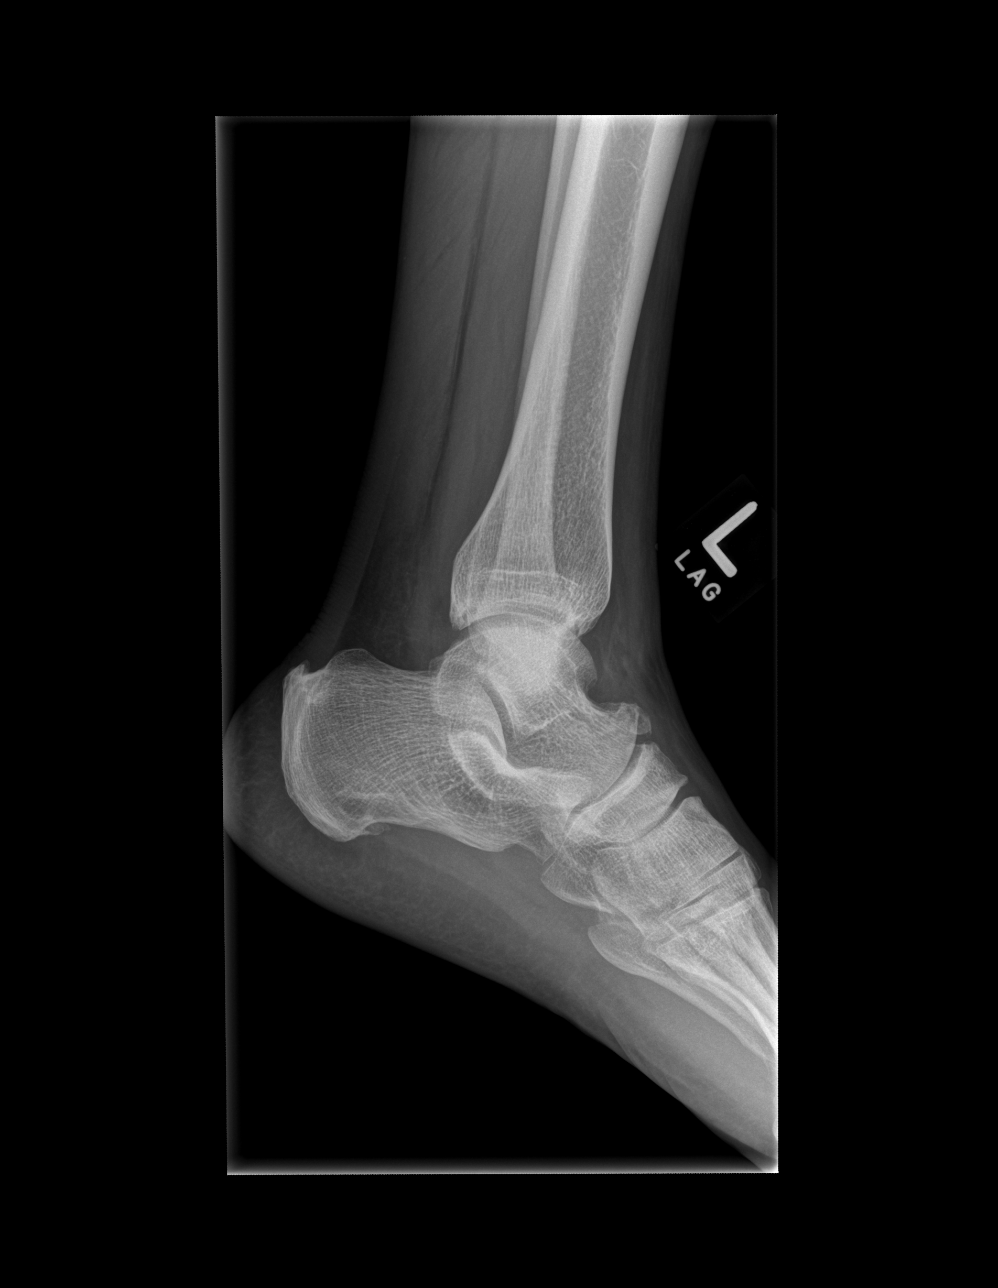

[3 of 3 positions shown; findings below may reference images not displayed]

FINDINGS: Calcaneal spurring is noted. Mild tarsal degenerative changes are
seen. No acute fracture or dislocation is noted. No soft tissue
abnormality is seen.
IMPRESSION: Mild degenerative changes without acute abnormality.

## 2020-12-27 ENCOUNTER — Ambulatory Visit: Payer: BC Managed Care – PPO | Admitting: Family

## 2020-12-27 DIAGNOSIS — Z0289 Encounter for other administrative examinations: Secondary | ICD-10-CM

## 2021-01-01 ENCOUNTER — Ambulatory Visit: Payer: BC Managed Care – PPO | Admitting: Family

## 2021-01-01 ENCOUNTER — Ambulatory Visit (INDEPENDENT_AMBULATORY_CARE_PROVIDER_SITE_OTHER): Payer: BC Managed Care – PPO

## 2021-01-01 ENCOUNTER — Other Ambulatory Visit: Payer: Self-pay

## 2021-01-01 ENCOUNTER — Encounter: Payer: Self-pay | Admitting: Family

## 2021-01-01 VITALS — BP 124/70 | HR 60 | Temp 98.0°F | Ht 66.0 in | Wt 195.2 lb

## 2021-01-01 DIAGNOSIS — M25562 Pain in left knee: Secondary | ICD-10-CM | POA: Diagnosis not present

## 2021-01-01 DIAGNOSIS — N529 Male erectile dysfunction, unspecified: Secondary | ICD-10-CM | POA: Diagnosis not present

## 2021-01-01 MED ORDER — MELOXICAM 15 MG PO TABS: 15.0000 mg | ORAL_TABLET | Freq: Every day | ORAL | 0 refills | Status: DC

## 2021-01-01 MED ORDER — SILDENAFIL CITRATE 100 MG PO TABS: 50.0000 mg | ORAL_TABLET | Freq: Every day | ORAL | 3 refills | Status: DC | PRN

## 2021-01-01 NOTE — Progress Notes (Signed)
Bryan Webb is a 50 y.o. male with the following history as recorded in [employer]:  Patient Active Problem List   Diagnosis Date Noted  . Retrognathia 06/28/2019  . Non-restorative sleep 06/28/2019  . Snoring 06/28/2019  . Vasculogenic erectile dysfunction 06/28/2019  . Concern about STD in male without diagnosis 04/17/2017  . Essential hypertension 01/03/2017  . Type 2 diabetes mellitus ([place]) 08/21/2016    Current Outpatient Medications  Medication Sig Dispense Refill  . amLODipine (NORVASC) 10 MG tablet Take 1 tablet (10 mg total) by mouth daily. 90 tablet 3  . cloNIDine (CATAPRES) 0.1 MG tablet Take 0.1 mg by mouth daily.    Marland Kitchen ezetimibe (ZETIA) 10 MG tablet Take 1 tablet (10 mg total) by mouth daily. 30 tablet 3  . losartan (COZAAR) 100 MG tablet Take 1 tablet (100 mg total) by mouth daily. 90 tablet 3  . meloxicam (MOBIC) 15 MG tablet Take 1 tablet (15 mg total) by mouth daily. 30 tablet 0  . metoprolol succinate (TOPROL-XL) 100 MG 24 hr tablet Take 1 tablet (100 mg total) by mouth daily. 90 tablet 3  . rosuvastatin (CRESTOR) 20 MG tablet Take 1 tablet (20 mg total) by mouth daily. 90 tablet 1  . Semaglutide (RYBELSUS) 7 MG TABS TAKE 1 TABLET BY MOUTH ONCE DAILY 30  MINUTES  BEFORE  BREAKFAST  WITH  WATER 90 tablet 1  . Garlic 2956 MG CAPS Take by mouth. (Patient not taking: Reported on 01/01/2021)    . OVER THE COUNTER MEDICATION Korean Ginseng over the counter 500 mg daily (Patient not taking: Reported on 01/01/2021)    . repaglinide (PRANDIN) 0.5 MG tablet Take 1 tablet (0.5 mg total) by mouth daily before supper. (Patient not taking: Reported on 01/01/2021) 30 tablet 2  . sildenafil (VIAGRA) 100 MG tablet Take 0.5 tablets (50 mg total) by mouth daily as needed. 10 tablet 3   No current facility-administered medications for this visit.    Allergies: Patient has no known allergies.  Past Medical History:  Diagnosis Date  . Arthritis    knee left   . Chicken pox   . Chronic  kidney disease   . Diabetes mellitus without complication ([place])   . GERD (gastroesophageal reflux disease)    mild   . Hyperlipidemia   . Hypertension   . Sleep apnea    has cpap- weras occ only     Past Surgical History:  Procedure Laterality Date  . COLONOSCOPY  05/16/2020  . DENTAL SURGERY     with sedation     Family History  Problem Relation Age of Onset  . Hypertension Mother   . Hypertension Father   . Colon polyps Father   . Hypertension Maternal Grandmother   . Hypertension Maternal Grandfather   . Hypertension Paternal Grandmother   . Diabetes Paternal Grandmother   . Hypertension Paternal Grandfather   . Colon cancer Neg Hx   . Esophageal cancer Neg Hx   . Rectal cancer Neg Hx   . Stomach cancer Neg Hx     Social History   Tobacco Use  . Smoking status: Current Every Day Smoker    Packs/day: 0.25    Years: 20.00    Pack years: 5.00  . Smokeless tobacco: Never Used  . Tobacco comment: considering quitting completely in the next year  Substance Use Topics  . Alcohol use: Yes    Comment: social on specila occasions ONLY    Subjective:  Left knee pain "on  and off" for the past 2-3 months; no specific injury; had previously been intermittent but symptoms are more persistent;      Objective:  Vitals:   01/01/21 1539  BP: 124/70  Pulse: 60  Temp: 98 F (36.7 C)  TempSrc: Oral  SpO2: 98%  Weight: 195 lb 3.2 oz (88.5 kg)  Height: 5\' 6"  (1.676 m)    General: Well developed, well nourished, in no acute distress  Skin : Warm and dry.  Head: Normocephalic and atraumatic  Lungs: Respirations unlabored; clear to auscultation bilaterally without wheeze, rales, rhonchi  Musculoskeletal: No deformities; no active joint inflammation  Extremities: No edema, cyanosis, clubbing  Vessels: Symmetric bilaterally  Neurologic: Alert and oriented; speech intact; face symmetrical; moves all extremities well; CNII-XII intact without focal deficit   Assessment:  1.  Left knee pain, unspecified chronicity   2. Erectile dysfunction, unspecified erectile dysfunction type     Plan:  1. Update X ray today; trial of Mobic 15 mg daily; will most likely refer to sports medicine; 2. Refill updated;   This visit occurred during the SARS-CoV-2 public health emergency.  Safety protocols were in place, including screening questions prior to the visit, additional usage of staff PPE, and extensive cleaning of exam room while observing appropriate contact time as indicated for disinfecting solutions.     No follow-ups on file.  Orders Placed This Encounter  Procedures  . DG Knee Complete 4 Views Left    Standing Status:   Future    Number of Occurrences:   1    Standing Expiration Date:   01/01/2022    Order Specific Question:   Reason for Exam (SYMPTOM  OR DIAGNOSIS REQUIRED)    Answer:   left knee pain    Order Specific Question:   Preferred imaging location?    Answer:   [facility]    Requested Prescriptions   Signed Prescriptions Disp Refills  . sildenafil (VIAGRA) 100 MG tablet 10 tablet 3    Sig: Take 0.5 tablets (50 mg total) by mouth daily as needed.  . meloxicam (MOBIC) 15 MG tablet 30 tablet 0    Sig: Take 1 tablet (15 mg total) by mouth daily.

## 2021-01-22 ENCOUNTER — Other Ambulatory Visit: Payer: BC Managed Care – PPO

## 2021-01-25 ENCOUNTER — Ambulatory Visit: Payer: BC Managed Care – PPO | Admitting: Endocrinology

## 2021-02-06 ENCOUNTER — Other Ambulatory Visit: Payer: Self-pay | Admitting: Endocrinology

## 2021-02-19 ENCOUNTER — Telehealth: Payer: Self-pay

## 2021-02-19 NOTE — Telephone Encounter (Signed)
Called patient advise office will close at 12:00 noon on February 21, 2021.  Left message for him to call and reschedule lab appt.

## 2021-02-21 ENCOUNTER — Other Ambulatory Visit: Payer: BC Managed Care – PPO

## 2021-02-22 ENCOUNTER — Other Ambulatory Visit (INDEPENDENT_AMBULATORY_CARE_PROVIDER_SITE_OTHER): Payer: BC Managed Care – PPO

## 2021-02-22 ENCOUNTER — Other Ambulatory Visit: Payer: Self-pay

## 2021-02-22 DIAGNOSIS — E782 Mixed hyperlipidemia: Secondary | ICD-10-CM | POA: Diagnosis not present

## 2021-02-22 DIAGNOSIS — E1165 Type 2 diabetes mellitus with hyperglycemia: Secondary | ICD-10-CM

## 2021-02-22 LAB — COMPREHENSIVE METABOLIC PANEL
ALT: 42 U/L (ref 0–53)
AST: 38 U/L — ABNORMAL HIGH (ref 0–37)
Albumin: 4.5 g/dL (ref 3.5–5.2)
Alkaline Phosphatase: 141 U/L — ABNORMAL HIGH (ref 39–117)
BUN: 36 mg/dL — ABNORMAL HIGH (ref 6–23)
CO2: 22 mEq/L (ref 19–32)
Calcium: 9.6 mg/dL (ref 8.4–10.5)
Chloride: 105 mEq/L (ref 96–112)
Creatinine, Ser: 2.24 mg/dL — ABNORMAL HIGH (ref 0.40–1.50)
GFR: 33.12 mL/min — ABNORMAL LOW (ref >60.00)
Glucose, Bld: 165 mg/dL — ABNORMAL HIGH (ref 70–99)
Potassium: 4.4 mEq/L (ref 3.5–5.1)
Sodium: 136 mEq/L (ref 135–145)
Total Bilirubin: 0.7 mg/dL (ref 0.2–1.2)
Total Protein: 8.3 g/dL (ref 6.0–8.3)

## 2021-02-22 LAB — HEMOGLOBIN A1C: Hgb A1c MFr Bld: 8.2 % — ABNORMAL HIGH (ref 4.6–6.5)

## 2021-02-22 LAB — LDL CHOLESTEROL, DIRECT: Direct LDL: 112 mg/dL

## 2021-02-23 LAB — FRUCTOSAMINE: Fructosamine: 325 umol/L — ABNORMAL HIGH (ref 0–285)

## 2021-02-26 ENCOUNTER — Encounter: Payer: Self-pay | Admitting: Endocrinology

## 2021-02-26 ENCOUNTER — Ambulatory Visit: Payer: BC Managed Care – PPO | Admitting: Endocrinology

## 2021-02-26 ENCOUNTER — Other Ambulatory Visit: Payer: Self-pay

## 2021-02-26 VITALS — BP 140/92 | HR 60 | Ht 66.0 in | Wt 193.0 lb

## 2021-02-26 DIAGNOSIS — E1165 Type 2 diabetes mellitus with hyperglycemia: Secondary | ICD-10-CM | POA: Diagnosis not present

## 2021-02-26 DIAGNOSIS — N184 Chronic kidney disease, stage 4 (severe): Secondary | ICD-10-CM

## 2021-02-26 DIAGNOSIS — E782 Mixed hyperlipidemia: Secondary | ICD-10-CM | POA: Diagnosis not present

## 2021-02-26 MED ORDER — ROSUVASTATIN CALCIUM 20 MG PO TABS: 20.0000 mg | ORAL_TABLET | Freq: Every day | ORAL | 1 refills | Status: DC

## 2021-02-26 MED ORDER — RYBELSUS 7 MG PO TABS: ORAL_TABLET | ORAL | 1 refills | Status: DC

## 2021-02-26 MED ORDER — FREESTYLE LIBRE 2 SENSOR MISC: 2.0000 | 3 refills | Status: DC

## 2021-02-26 NOTE — Patient Instructions (Addendum)
Prandin before each meals  Ok to use [place]

## 2021-02-26 NOTE — Progress Notes (Signed)
Patient ID: Bryan Webb, male   DOB: 01/05/1971, 50 y.o.   MRN: [id number]           Reason for Appointment: Follow-up    History of Present Illness:          Date of diagnosis of type 2 diabetes mellitus: 07/2016       Background history:   He was initially diagnosed at an emergency room when he presented with high blood sugars He was initially given Lantus insulin but he was switched to Trulicity by his PCP He says he had better blood sugars with Trulicity and his B1D had gone down to 7.4 However not clear why he was put back on insulin after about 3 months and also Metformin He could not tolerate more than 1 tablet of 500 mg metformin ER  Recent history:   Most recent A1c is now 8.2 Fructosamine last 311, now 325  Non-insulin hypoglycemic drugs the patient is taking are: Rybelsus 7 mg daily, Prandin 0.5 mg at dinner  Current management, blood sugar patterns and problems identified: He did not bring his meter again and appears to be checking blood sugars very rarely his lab glucose after lunch was 165 Is mostly eating 2 meals a day and skipping breakfast his weight appears to have gone down 5 pounds since her last visit He only was out of his Rybelsus for 4 days and not clear why his A1c is higher Still unclear how his blood sugars are after dinner but he does not think they are more than 160 He was told to cut back on juices on the last visit He thinks he is fairly active during the day Has taken his Rybelsus in the morning, no nausea with not eating breakfast       Side effects from medications have been: Nausea from 14 mg Rybelsus     Typical meal intake: Breakfast is variable, usually 3 meals a day            Exercise: walks at work    Glucose monitoring:  done less than 1 times a day         Glucometer:   Contour       Blood Glucose readings in the morning ? Not checking after meals, once 160   Dietician visit, most recent: 12/2018  Weight history: Maximum  240  Wt Readings from Last 3 Encounters:  02/26/21 193 lb (87.5 kg)  01/01/21 195 lb 3.2 oz (88.5 kg)  09/06/20 198 lb (89.8 kg)    Glycemic control:   Lab Results  Component Value Date   HGBA1C 8.2 (H) 02/22/2021   HGBA1C 7.7 (H) 08/24/2020   HGBA1C 7.8 (H) 03/29/2020   Lab Results  Component Value Date   MICROALBUR 17.7 (H) 08/24/2020   LDLCALC 76 11/23/2019   CREATININE 2.24 (H) 02/22/2021   Lab Results  Component Value Date   MICRALBCREAT 10.0 08/24/2020    Lab Results  Component Value Date   FRUCTOSAMINE 325 (H) 02/22/2021   FRUCTOSAMINE 311 (H) 03/29/2020   FRUCTOSAMINE 321 (H) 11/23/2019    Lab on 02/22/2021  Component Date Value Ref Range Status   Direct LDL 02/22/2021 112.0  mg/dL Final   Optimal:  <100 mg/dLNear or Above Optimal:  100-129 mg/dLBorderline High:  130-159 mg/dLHigh:  160-189 mg/dLVery High:  >190 mg/dL   Fructosamine 02/22/2021 325 (A) 0 - 285 umol/L Final   Comment: Published reference interval for apparently healthy subjects between age 3 and 81  is 205 - 285 umol/L and in a poorly controlled diabetic population is 228 - 563 umol/L with a mean of 396 umol/L.    Sodium 02/22/2021 136  135 - 145 mEq/L Final   Potassium 02/22/2021 4.4  3.5 - 5.1 mEq/L Final   Chloride 02/22/2021 105  96 - 112 mEq/L Final   CO2 02/22/2021 22  19 - 32 mEq/L Final   Glucose, Bld 02/22/2021 165 (A) 70 - 99 mg/dL Final   BUN 02/22/2021 36 (A) 6 - 23 mg/dL Final   Creatinine, Ser 02/22/2021 2.24 (A) 0.40 - 1.50 mg/dL Final   Total Bilirubin 02/22/2021 0.7  0.2 - 1.2 mg/dL Final   Alkaline Phosphatase 02/22/2021 141 (A) 39 - 117 U/L Final   AST 02/22/2021 38 (A) 0 - 37 U/L Final   ALT 02/22/2021 42  0 - 53 U/L Final   Total Protein 02/22/2021 8.3  6.0 - 8.3 g/dL Final   Albumin 02/22/2021 4.5  3.5 - 5.2 g/dL Final   GFR 02/22/2021 33.12 (A) >60.00 mL/min Final   Calculated using the CKD-EPI Creatinine Equation (2021)   Calcium 02/22/2021 9.6  8.4 - 10.5 mg/dL  Final   Hgb A1c MFr Bld 02/22/2021 8.2 (A) 4.6 - 6.5 % Final   Glycemic Control Guidelines for People with Diabetes:Non Diabetic:  <6%Goal of Therapy: <7%Additional Action Suggested:  >8%     Allergies as of 02/26/2021   No Known Allergies      Medication List        Accurate as of February 26, 2021  2:42 PM. If you have any questions, ask your nurse or doctor.          amLODipine 10 MG tablet Commonly known as: NORVASC Take 1 tablet (10 mg total) by mouth daily.   cloNIDine 0.1 MG tablet Commonly known as: CATAPRES Take 0.1 mg by mouth daily.   ezetimibe 10 MG tablet Commonly known as: Zetia Take 1 tablet (10 mg total) by mouth daily.   Garlic 0938 MG Caps Take by mouth.   losartan 100 MG tablet Commonly known as: COZAAR Take 1 tablet (100 mg total) by mouth daily.   meloxicam 15 MG tablet Commonly known as: MOBIC Take 1 tablet (15 mg total) by mouth daily.   metoprolol succinate 100 MG 24 hr tablet Commonly known as: TOPROL-XL Take 1 tablet (100 mg total) by mouth daily.   OVER THE COUNTER MEDICATION Korean Ginseng over the counter 500 mg daily   repaglinide 0.5 MG tablet Commonly known as: PRANDIN Take 1 tablet (0.5 mg total) by mouth daily before supper.   rosuvastatin 20 MG tablet Commonly known as: CRESTOR Take 1 tablet (20 mg total) by mouth daily.   Rybelsus 7 MG Tabs Generic drug: Semaglutide TAKE 1 TABLET BY MOUTH ONCE DAILY 30  MINUTES  BEFORE  BREAKFAST  WITH  WATER   sildenafil 100 MG tablet Commonly known as: VIAGRA Take 0.5 tablets (50 mg total) by mouth daily as needed.        Allergies: No Known Allergies  Past Medical History:  Diagnosis Date   Arthritis    knee left    Chicken pox    Chronic kidney disease    Diabetes mellitus without complication ([facility])    GERD (gastroesophageal reflux disease)    mild    Hyperlipidemia    Hypertension    Sleep apnea    has cpap- weras occ only     Past Surgical History:  Procedure  Laterality Date   COLONOSCOPY  05/16/2020   DENTAL SURGERY     with sedation     Family History  Problem Relation Age of Onset   Hypertension Mother    Hypertension Father    Colon polyps Father    Hypertension Maternal Grandmother    Hypertension Maternal Grandfather    Hypertension Paternal Grandmother    Diabetes Paternal Grandmother    Hypertension Paternal Grandfather    Colon cancer Neg Hx    Esophageal cancer Neg Hx    Rectal cancer Neg Hx    Stomach cancer Neg Hx     Social History:  reports that he has been smoking cigarettes. He has a 5.00 pack-year smoking history. He has never used smokeless tobacco. He reports current alcohol use. He reports that he does not use drugs.   Review of Systems   Lipid history: He was started on Crestor 20 mg when he was not tolerating Lipitor  He recently has taken his Crestor better and ran out 2 or 3 days before his labs  Previously LDL with the combination is below 100, but now LDL is 112 Triglycerides are high, labs were nonfasting  Lab Results  Component Value Date   CHOL 271 (H) 08/24/2020   CHOL 141 11/23/2019   CHOL 232 (H) 08/23/2019   Lab Results  Component Value Date   HDL 34.40 (L) 08/24/2020   HDL 35.00 (L) 11/23/2019   HDL 35.60 (L) 08/23/2019   Lab Results  Component Value Date   LDLCALC 76 11/23/2019   LDLCALC 131 (H) 05/18/2019   LDLCALC 111 (H) 02/01/2019   Lab Results  Component Value Date   TRIG 264.0 (H) 08/24/2020   TRIG 150.0 (H) 11/23/2019   TRIG 263.0 (H) 08/23/2019   Lab Results  Component Value Date   CHOLHDL 8 08/24/2020   CHOLHDL 4 11/23/2019   CHOLHDL 7 08/23/2019   Lab Results  Component Value Date   LDLDIRECT 112.0 02/22/2021   LDLDIRECT 180.0 08/24/2020   LDLDIRECT 158.0 08/23/2019            Hypertension: Has been treated with losartan 100 mg, clonidine 0.1, metoprolol 100 mg and amlodipine 10 mg daily On treatment for several years  Home BP is checked regularly,  followed by nephrologist every 6 months now   BP Readings from Last 3 Encounters:  02/26/21 (!) 140/92  01/01/21 124/70  09/06/20 138/76    Most recent eye exam was in 5/20  Most recent foot exam: 10/2018  Currently known complications of diabetes: Erectile dysfunction  Chronic kidney disease: Values as below, also seen by nephrologist His kidney doctor did want to start Farxiga but he did not have an upcoming visit  Lab Results  Component Value Date   CREATININE 2.24 (H) 02/22/2021   CREATININE 2.17 (H) 08/24/2020   CREATININE 2.65 (H) 03/29/2020   Reportedly has fatty liver with persistently high liver functions, slightly better than before  Lab Results  Component Value Date   ALT 42 02/22/2021     LABS:  Lab on 02/22/2021  Component Date Value Ref Range Status   Direct LDL 02/22/2021 112.0  mg/dL Final   Optimal:  <100 mg/dLNear or Above Optimal:  100-129 mg/dLBorderline High:  130-159 mg/dLHigh:  160-189 mg/dLVery High:  >190 mg/dL   Fructosamine 02/22/2021 325 (A) 0 - 285 umol/L Final   Comment: Published reference interval for apparently healthy subjects between age 43 and 63 is 28 - 285 umol/L and in a  poorly controlled diabetic population is 228 - 563 umol/L with a mean of 396 umol/L.    Sodium 02/22/2021 136  135 - 145 mEq/L Final   Potassium 02/22/2021 4.4  3.5 - 5.1 mEq/L Final   Chloride 02/22/2021 105  96 - 112 mEq/L Final   CO2 02/22/2021 22  19 - 32 mEq/L Final   Glucose, Bld 02/22/2021 165 (A) 70 - 99 mg/dL Final   BUN 02/22/2021 36 (A) 6 - 23 mg/dL Final   Creatinine, Ser 02/22/2021 2.24 (A) 0.40 - 1.50 mg/dL Final   Total Bilirubin 02/22/2021 0.7  0.2 - 1.2 mg/dL Final   Alkaline Phosphatase 02/22/2021 141 (A) 39 - 117 U/L Final   AST 02/22/2021 38 (A) 0 - 37 U/L Final   ALT 02/22/2021 42  0 - 53 U/L Final   Total Protein 02/22/2021 8.3  6.0 - 8.3 g/dL Final   Albumin 02/22/2021 4.5  3.5 - 5.2 g/dL Final   GFR 02/22/2021 33.12 (A) >60.00  mL/min Final   Calculated using the CKD-EPI Creatinine Equation (2021)   Calcium 02/22/2021 9.6  8.4 - 10.5 mg/dL Final   Hgb A1c MFr Bld 02/22/2021 8.2 (A) 4.6 - 6.5 % Final   Glycemic Control Guidelines for People with Diabetes:Non Diabetic:  <6%Goal of Therapy: <7%Additional Action Suggested:  >8%     Physical Examination:  BP (!) 140/92   Pulse 60   Ht 5\' 6"  (1.676 m)   Wt 193 lb (87.5 kg)   SpO2 97%   BMI 31.15 kg/m       ASSESSMENT:  Diabetes type 2, and obesity  See history of present illness for detailed discussion of current diabetes management, blood sugar patterns and problems identified  His A1c is 8.2, previously was at 7.7  Without his checking his blood sugars regularly difficult to know what his blood sugar patterns are Likely needs to be taking Prandin with every meal but not clear if he needs a higher dose at dinnertime Diet has been variable although he has lost weight, can cut back on juices  He forgets to check his sugars after meals and does not bring her monitor for download also  Hypercholesterolemia: He has marked increase in cholesterol from not refilling his Crestor  HYPERTENSION: Blood pressure is relatively high and needs to follow-up with nephrologist  CKD: Discussed that he needs to follow-up with his nephrologist and may start Farxiga if needed from the nephrology perspective  Mild increase in liver functions: Improved  PLAN:    He says he can start the freestyle libre as it is covered now and discussed how this works and given him patient information on this Prescription sent Start taking Prandin both at lunch and dinnertime Stay with Rybelsus in the mornings 7 mg  Renew Crestor, 90-day prescription sent More regular follow-up   There are no Patient Instructions on file for this visit.       Elayne Snare 02/26/2021, 2:42 PM   Note: This office note was prepared with Dragon voice recognition system technology. Any  transcriptional errors that result from this process are unintentional.

## 2021-05-22 ENCOUNTER — Other Ambulatory Visit: Payer: Self-pay

## 2021-05-22 ENCOUNTER — Other Ambulatory Visit (INDEPENDENT_AMBULATORY_CARE_PROVIDER_SITE_OTHER): Payer: BC Managed Care – PPO

## 2021-05-22 DIAGNOSIS — E1165 Type 2 diabetes mellitus with hyperglycemia: Secondary | ICD-10-CM | POA: Diagnosis not present

## 2021-05-22 LAB — GLUCOSE, RANDOM: Glucose, Bld: 152 mg/dL — ABNORMAL HIGH (ref 70–99)

## 2021-05-22 LAB — HEMOGLOBIN A1C: Hgb A1c MFr Bld: 7.4 % — ABNORMAL HIGH (ref 4.6–6.5)

## 2021-05-23 LAB — FRUCTOSAMINE: Fructosamine: 280 umol/L (ref 0–285)

## 2021-05-23 NOTE — Progress Notes (Signed)
Patient ID: Bryan Webb, male   DOB: 08-Jul-1971, 50 y.o.   MRN: [id number]           Reason for Appointment: Follow-up    History of Present Illness:          Date of diagnosis of type 2 diabetes mellitus: 07/2016       Background history:   He was initially diagnosed at an emergency room when he presented with high blood sugars He was initially given Lantus insulin but he was switched to Trulicity by his PCP He says he had better blood sugars with Trulicity and his Z5G had gone down to 7.4 However not clear why he was put back on insulin after about 3 months and also Metformin He could not tolerate more than 1 tablet of 500 mg metformin ER  Recent history:   Most recent A1c is now 7.4 compared to 8.2 Fructosamine last 325  Non-insulin hypoglycemic drugs the patient is taking are: Rybelsus 7 mg daily, Prandin 0.5 mg at lunch and dinner  Current management, blood sugar patterns and problems identified: He finally has started using the freestyle libre sensor, previously was not checking blood sugars much and would not bring his meter for download  With this he thinks he is able to focus on his diet better and has improved control  His accuracy with the sensor appears to be fairly good compared to the lab reading at the same time He is also asked to take his Prandin both at lunch and dinner  However he still not checking his blood sugars consistently in the evening after supper and the current sensor was only started 4 days ago  HYPERGLYCEMIA has been minimal with no consistent blood sugar spikes after meals  However blood sugars appear to be somewhat variable overnight  No hypoglycemia with Prandin  his lab glucose after lunch was 152 He is not doing much formal exercise but he thinks he stays active at work and during the day Has taken his Rybelsus in the morning, no nausea with 7 mg dose       Side effects from medications have been: Nausea from 14 mg Rybelsus     Typical  meal intake: Breakfast is variable, usually 3 meals a day            Exercise: walks at work    Glucose monitoring: Freestyle libre version 2  Time in Target = 91%  PRE-MEAL Fasting Lunch Dinner Bedtime Overall  Glucose range:       Mean/median: 126 152 136  139   POST-MEAL PC Breakfast PC Lunch PC Dinner  Glucose range:     Mean/median: 151 166 122       Blood Glucose readings in the morning ? Not checking after meals, once 160   Dietician visit, most recent: 12/2018  Weight history: Maximum 240  Wt Readings from Last 3 Encounters:  05/24/21 193 lb 3.2 oz (87.6 kg)  02/26/21 193 lb (87.5 kg)  01/01/21 195 lb 3.2 oz (88.5 kg)    Glycemic control:   Lab Results  Component Value Date   HGBA1C 7.4 (H) 05/22/2021   HGBA1C 8.2 (H) 02/22/2021   HGBA1C 7.7 (H) 08/24/2020   Lab Results  Component Value Date   MICROALBUR 17.7 (H) 08/24/2020   LDLCALC 76 11/23/2019   CREATININE 2.24 (H) 02/22/2021   Lab Results  Component Value Date   MICRALBCREAT 10.0 08/24/2020    Lab Results  Component Value Date  FRUCTOSAMINE 280 05/22/2021   FRUCTOSAMINE 325 (H) 02/22/2021   FRUCTOSAMINE 311 (H) 03/29/2020    Lab on 05/22/2021  Component Date Value Ref Range Status   Glucose, Bld 05/22/2021 152 (A) 70 - 99 mg/dL Final   Hgb A1c MFr Bld 05/22/2021 7.4 (A) 4.6 - 6.5 % Final   Glycemic Control Guidelines for People with Diabetes:Non Diabetic:  <6%Goal of Therapy: <7%Additional Action Suggested:  >8%    Fructosamine 05/22/2021 280  0 - 285 umol/L Final   Comment: Published reference interval for apparently healthy subjects between age 1 and 5 is 60 - 285 umol/L and in a poorly controlled diabetic population is 228 - 563 umol/L with a mean of 396 umol/L.     Allergies as of 05/24/2021   No Known Allergies      Medication List        Accurate as of May 24, 2021  4:00 PM. If you have any questions, ask your nurse or doctor.          amLODipine 10 MG  tablet Commonly known as: NORVASC Take 1 tablet (10 mg total) by mouth daily.   cloNIDine 0.1 MG tablet Commonly known as: CATAPRES Take 0.1 mg by mouth daily.   ezetimibe 10 MG tablet Commonly known as: Zetia Take 1 tablet (10 mg total) by mouth daily.   FreeStyle Libre 2 Sensor Misc 2 Devices by Does not apply route every 14 (fourteen) days.   Garlic 7106 MG Caps Take by mouth.   losartan 100 MG tablet Commonly known as: COZAAR Take 1 tablet (100 mg total) by mouth daily.   meloxicam 15 MG tablet Commonly known as: MOBIC Take 1 tablet (15 mg total) by mouth daily.   metoprolol succinate 100 MG 24 hr tablet Commonly known as: TOPROL-XL Take 1 tablet (100 mg total) by mouth daily.   OVER THE COUNTER MEDICATION Korean Ginseng over the counter 500 mg daily   repaglinide 0.5 MG tablet Commonly known as: PRANDIN Take 1 tablet (0.5 mg total) by mouth daily before supper.   rosuvastatin 20 MG tablet Commonly known as: CRESTOR Take 1 tablet (20 mg total) by mouth daily.   Rybelsus 7 MG Tabs Generic drug: Semaglutide TAKE 1 TABLET BY MOUTH ONCE DAILY 30  MINUTES  BEFORE  BREAKFAST  WITH  WATER   sildenafil 100 MG tablet Commonly known as: VIAGRA Take 0.5 tablets (50 mg total) by mouth daily as needed.        Allergies: No Known Allergies  Past Medical History:  Diagnosis Date   Arthritis    knee left    Chicken pox    Chronic kidney disease    Diabetes mellitus without complication (HCC)    GERD (gastroesophageal reflux disease)    mild    Hyperlipidemia    Hypertension    Sleep apnea    has cpap- weras occ only     Past Surgical History:  Procedure Laterality Date   COLONOSCOPY  05/16/2020   DENTAL SURGERY     with sedation     Family History  Problem Relation Age of Onset   Hypertension Mother    Hypertension Father    Colon polyps Father    Hypertension Maternal Grandmother    Hypertension Maternal Grandfather    Hypertension Paternal  Grandmother    Diabetes Paternal Grandmother    Hypertension Paternal Grandfather    Colon cancer Neg Hx    Esophageal cancer Neg Hx    Rectal cancer  Neg Hx    Stomach cancer Neg Hx     Social History:  reports that he has been smoking cigarettes. He has a 5.00 pack-year smoking history. He has never used smokeless tobacco. He reports current alcohol use. He reports that he does not use drugs.   Review of Systems   Lipid history: He was started on Crestor 20 mg when he was not tolerating Lipitor Also has been prescribed Zetia previously but not taking this recently Previously LDL with the combination is below 100, but last direct LDL is 112 Triglycerides are high, labs were nonfasting  Lab Results  Component Value Date   CHOL 271 (H) 08/24/2020   CHOL 141 11/23/2019   CHOL 232 (H) 08/23/2019   Lab Results  Component Value Date   HDL 34.40 (L) 08/24/2020   HDL 35.00 (L) 11/23/2019   HDL 35.60 (L) 08/23/2019   Lab Results  Component Value Date   LDLCALC 76 11/23/2019   LDLCALC 131 (H) 05/18/2019   LDLCALC 111 (H) 02/01/2019   Lab Results  Component Value Date   TRIG 264.0 (H) 08/24/2020   TRIG 150.0 (H) 11/23/2019   TRIG 263.0 (H) 08/23/2019   Lab Results  Component Value Date   CHOLHDL 8 08/24/2020   CHOLHDL 4 11/23/2019   CHOLHDL 7 08/23/2019   Lab Results  Component Value Date   LDLDIRECT 112.0 02/22/2021   LDLDIRECT 180.0 08/24/2020   LDLDIRECT 158.0 08/23/2019            Hypertension: Has been treated with losartan 100 mg, clonidine 0.1, metoprolol 100 mg and amlodipine 10 mg daily On treatment for several years  Home BP is checked regularly, followed by nephrologist   BP Readings from Last 3 Encounters:  05/24/21 (!) 160/110  02/26/21 (!) 140/92  01/01/21 124/70    Most recent eye exam was in 5/20  Most recent foot exam: 10/2018  Currently known complications of diabetes: Erectile dysfunction  Chronic kidney disease: Values as below, also  seen by nephrologist   Lab Results  Component Value Date   CREATININE 2.24 (H) 02/22/2021   CREATININE 2.17 (H) 08/24/2020   CREATININE 2.65 (H) 03/29/2020   Reportedly has fatty liver with persistently high liver functions, slightly better than before  Lab Results  Component Value Date   ALT 42 02/22/2021     LABS:  Lab on 05/22/2021  Component Date Value Ref Range Status   Glucose, Bld 05/22/2021 152 (A) 70 - 99 mg/dL Final   Hgb A1c MFr Bld 05/22/2021 7.4 (A) 4.6 - 6.5 % Final   Glycemic Control Guidelines for People with Diabetes:Non Diabetic:  <6%Goal of Therapy: <7%Additional Action Suggested:  >8%    Fructosamine 05/22/2021 280  0 - 285 umol/L Final   Comment: Published reference interval for apparently healthy subjects between age 29 and 43 is 45 - 285 umol/L and in a poorly controlled diabetic population is 228 - 563 umol/L with a mean of 396 umol/L.     Physical Examination:  BP (!) 160/110   Pulse 60   Ht 5\' 6"  (1.676 m)   Wt 193 lb 3.2 oz (87.6 kg)   SpO2 98%   BMI 31.18 kg/m       ASSESSMENT:  Diabetes type 2, and obesity  See history of present illness for detailed discussion of current diabetes management, blood sugar patterns and problems identified  His A1c is improved at 7.4  With monitoring his blood sugars using freestyle Elenor Legato he is  able to take his medications, watch his diet and have generally better control Although he is controlling his readings postprandially with brand-name does sometimes have higher readings before breakfast but average fasting reading is 126  HYPERTENSION: Blood pressure is high and needs to follow-up with nephrologist   PLAN:    Continue 0.5 mg Prandin both at lunch and dinnertime Stay with Rybelsus in the mornings 7 mg He will discuss using Farxiga with his nephrologist  Follow-up lipids on the next visit also   There are no Patient Instructions on file for this visit.       Elayne Snare 05/24/2021,  4:00 PM   Note: This office note was prepared with Dragon voice recognition system technology. Any transcriptional errors that result from this process are unintentional.

## 2021-05-24 ENCOUNTER — Encounter: Payer: Self-pay | Admitting: Endocrinology

## 2021-05-24 ENCOUNTER — Other Ambulatory Visit: Payer: Self-pay

## 2021-05-24 ENCOUNTER — Ambulatory Visit: Payer: BC Managed Care – PPO | Admitting: Endocrinology

## 2021-05-24 VITALS — BP 160/110 | HR 60 | Ht 66.0 in | Wt 193.2 lb

## 2021-05-24 DIAGNOSIS — E782 Mixed hyperlipidemia: Secondary | ICD-10-CM | POA: Diagnosis not present

## 2021-05-24 DIAGNOSIS — E1165 Type 2 diabetes mellitus with hyperglycemia: Secondary | ICD-10-CM | POA: Diagnosis not present

## 2021-08-16 DIAGNOSIS — E1129 Type 2 diabetes mellitus with other diabetic kidney complication: Secondary | ICD-10-CM | POA: Diagnosis not present

## 2021-08-16 DIAGNOSIS — N1832 Chronic kidney disease, stage 3b: Secondary | ICD-10-CM | POA: Diagnosis not present

## 2021-08-16 DIAGNOSIS — I129 Hypertensive chronic kidney disease with stage 1 through stage 4 chronic kidney disease, or unspecified chronic kidney disease: Secondary | ICD-10-CM | POA: Diagnosis not present

## 2021-08-16 DIAGNOSIS — R809 Proteinuria, unspecified: Secondary | ICD-10-CM | POA: Diagnosis not present

## 2021-09-04 ENCOUNTER — Telehealth: Payer: Self-pay

## 2021-09-04 ENCOUNTER — Other Ambulatory Visit (HOSPITAL_COMMUNITY): Payer: Self-pay

## 2021-09-04 NOTE — Telephone Encounter (Signed)
Patient Advocate Encounter  Prior Authorization for Rybelsus 7mg  tabs has been approved.    PA# N/A  Effective dates: 09/04/21 through 09/03/22  Per Test Claim Patients copay is N/A, RTS.   Spoke with Pharmacy to Process.  Patient Advocate Fax:  [phone]

## 2021-09-25 ENCOUNTER — Other Ambulatory Visit: Payer: BC Managed Care – PPO

## 2021-09-27 ENCOUNTER — Ambulatory Visit: Payer: BC Managed Care – PPO | Admitting: Endocrinology

## 2021-11-02 ENCOUNTER — Ambulatory Visit: Payer: BC Managed Care – PPO | Admitting: Family

## 2021-11-02 ENCOUNTER — Encounter: Payer: Self-pay | Admitting: Family

## 2021-11-02 VITALS — BP 174/96 | HR 57 | Temp 98.1°F | Ht 68.0 in | Wt 201.8 lb

## 2021-11-02 DIAGNOSIS — R079 Chest pain, unspecified: Secondary | ICD-10-CM | POA: Diagnosis not present

## 2021-11-02 DIAGNOSIS — R0789 Other chest pain: Secondary | ICD-10-CM | POA: Diagnosis not present

## 2021-11-02 DIAGNOSIS — I10 Essential (primary) hypertension: Secondary | ICD-10-CM

## 2021-11-02 MED ORDER — AMLODIPINE BESYLATE 10 MG PO TABS: 10.0000 mg | ORAL_TABLET | Freq: Every day | ORAL | 3 refills | Status: DC

## 2021-11-02 MED ORDER — LOSARTAN POTASSIUM 100 MG PO TABS: 100.0000 mg | ORAL_TABLET | Freq: Every day | ORAL | 3 refills | Status: DC

## 2021-11-02 MED ORDER — CLONIDINE HCL 0.1 MG PO TABS: 0.1000 mg | ORAL_TABLET | Freq: Every day | ORAL | 3 refills | Status: DC

## 2021-11-02 MED ORDER — METOPROLOL SUCCINATE ER 100 MG PO TB24: 100.0000 mg | ORAL_TABLET | Freq: Every day | ORAL | 3 refills | Status: DC

## 2021-11-02 NOTE — Progress Notes (Signed)
Bryan Webb is a 51 y.o. male with the following history as recorded in EpicCare:  Patient Active Problem List   Diagnosis Date Noted   Retrognathia 06/28/2019   Non-restorative sleep 06/28/2019   Snoring 06/28/2019   Vasculogenic erectile dysfunction 06/28/2019   Concern about STD in male without diagnosis 04/17/2017   Essential hypertension 01/03/2017   Type 2 diabetes mellitus ([place]) 08/21/2016    Current Outpatient Medications  Medication Sig Dispense Refill   Continuous Blood Gluc Sensor (FREESTYLE LIBRE 2 SENSOR) MISC 2 Devices by Does not apply route every 14 (fourteen) days. 2 each 3   ezetimibe (ZETIA) 10 MG tablet Take 1 tablet (10 mg total) by mouth daily. 30 tablet 3   FARXIGA 10 MG TABS tablet Take 10 mg by mouth daily.     Garlic 2426 MG CAPS Take by mouth.     meloxicam (MOBIC) 15 MG tablet Take 1 tablet (15 mg total) by mouth daily. 30 tablet 0   repaglinide (PRANDIN) 0.5 MG tablet Take 1 tablet (0.5 mg total) by mouth daily before supper. 30 tablet 2   rosuvastatin (CRESTOR) 20 MG tablet Take 1 tablet (20 mg total) by mouth daily. 90 tablet 1   Semaglutide (RYBELSUS) 7 MG TABS TAKE 1 TABLET BY MOUTH ONCE DAILY 30  MINUTES  BEFORE  BREAKFAST  WITH  WATER 90 tablet 1   sildenafil (VIAGRA) 100 MG tablet Take 0.5 tablets (50 mg total) by mouth daily as needed. 10 tablet 3   Vitamin D, Ergocalciferol, (DRISDOL) 1.25 MG (50000 UNIT) CAPS capsule Take 50,000 Units by mouth once a week.     amLODipine (NORVASC) 10 MG tablet Take 1 tablet (10 mg total) by mouth daily. 90 tablet 3   cloNIDine (CATAPRES) 0.1 MG tablet Take 1 tablet (0.1 mg total) by mouth daily. 90 tablet 3   losartan (COZAAR) 100 MG tablet Take 1 tablet (100 mg total) by mouth daily. 90 tablet 3   metoprolol succinate (TOPROL-XL) 100 MG 24 hr tablet Take 1 tablet (100 mg total) by mouth daily. 90 tablet 3   OVER THE COUNTER MEDICATION Korean Ginseng over the counter 500 mg daily (Patient not taking: Reported on  11/02/2021)     No current facility-administered medications for this visit.    Allergies: Patient has no known allergies.  Past Medical History:  Diagnosis Date   Arthritis    knee left    Chicken pox    Chronic kidney disease    Diabetes mellitus without complication (HCC)    GERD (gastroesophageal reflux disease)    mild    Hyperlipidemia    Hypertension    Sleep apnea    has cpap- weras occ only     Past Surgical History:  Procedure Laterality Date   COLONOSCOPY  05/16/2020   DENTAL SURGERY     with sedation     Family History  Problem Relation Age of Onset   Hypertension Mother    Hypertension Father    Colon polyps Father    Hypertension Maternal Grandmother    Hypertension Maternal Grandfather    Hypertension Paternal Grandmother    Diabetes Paternal Grandmother    Hypertension Paternal Grandfather    Colon cancer Neg Hx    Esophageal cancer Neg Hx    Rectal cancer Neg Hx    Stomach cancer Neg Hx     Social History   Tobacco Use   Smoking status: Every Day    Packs/day: 0.25  Years: 20.00    Pack years: 5.00    Types: Cigarettes   Smokeless tobacco: Never   Tobacco comments:    considering quitting completely in the next year  Substance Use Topics   Alcohol use: Yes    Comment: social on specila occasions ONLY    Subjective:  Follow up on hypertension; not on medication today; admits that he has been having some atypical chest pain on and off for the past 2-3 months; denies any chest pain on exertion; + smoker; overdue to see his endocrinologist;     Objective:  Vitals:   11/02/21 1553  BP: (!) 174/96  Pulse: (!) 57  Temp: 98.1 F (36.7 C)  TempSrc: Oral  SpO2: 97%  Weight: 201 lb 12.8 oz (91.5 kg)  Height: 5\' 8"  (1.727 m)    General: Well developed, well nourished, in no acute distress  Skin : Warm and dry.  Head: Normocephalic and atraumatic  Lungs: Respirations unlabored; clear to auscultation bilaterally without wheeze, rales,  rhonchi  CVS exam: normal rate and regular rhythm.  Vessels: Symmetric bilaterally  Neurologic: Alert and oriented; speech intact; face symmetrical; moves all extremities well; CNII-XII intact without focal deficit   Assessment:  1. Chest pain, unspecified type   2. Essential hypertension   3. Atypical chest pain     Plan:  Update EKG- shows sinus bradycardia; refill updated on medications and stressed need to take blood pressure medication daily as prescribed; stressed need to quit smoking;  Will update CXR and refer to cardiology for further evaluation due to risk factors;  This visit occurred during the SARS-CoV-2 public health emergency.  Safety protocols were in place, including screening questions prior to the visit, additional usage of staff PPE, and extensive cleaning of exam room while observing appropriate contact time as indicated for disinfecting solutions.    No follow-ups on file.  Orders Placed This Encounter  Procedures   DG Chest 2 View    Standing Status:   Future    Standing Expiration Date:   11/02/2022    Order Specific Question:   Reason for Exam (SYMPTOM  OR DIAGNOSIS REQUIRED)    Answer:   atypical chest pain    Order Specific Question:   Preferred imaging location?    Answer:   [facility]   Ambulatory referral to Cardiology    Referral Priority:   Routine    Referral Type:   Consultation    Referral Reason:   Specialty Services Required    Requested Specialty:   Cardiology    Number of Visits Requested:   1   EKG 12-Lead    Requested Prescriptions   Signed Prescriptions Disp Refills   amLODipine (NORVASC) 10 MG tablet 90 tablet 3    Sig: Take 1 tablet (10 mg total) by mouth daily.   cloNIDine (CATAPRES) 0.1 MG tablet 90 tablet 3    Sig: Take 1 tablet (0.1 mg total) by mouth daily.   losartan (COZAAR) 100 MG tablet 90 tablet 3    Sig: Take 1 tablet (100 mg total) by mouth daily.   metoprolol succinate (TOPROL-XL) 100 MG 24 hr tablet 90 tablet 3     Sig: Take 1 tablet (100 mg total) by mouth daily.

## 2021-11-29 ENCOUNTER — Other Ambulatory Visit: Payer: Self-pay

## 2021-11-29 ENCOUNTER — Encounter: Payer: Self-pay | Admitting: *Deleted

## 2021-11-29 ENCOUNTER — Ambulatory Visit: Payer: BC Managed Care – PPO | Admitting: Interventional Cardiology

## 2021-11-29 ENCOUNTER — Encounter: Payer: Self-pay | Admitting: Interventional Cardiology

## 2021-11-29 VITALS — BP 122/66 | HR 58 | Ht 68.0 in | Wt 203.0 lb

## 2021-11-29 DIAGNOSIS — E1122 Type 2 diabetes mellitus with diabetic chronic kidney disease: Secondary | ICD-10-CM | POA: Diagnosis not present

## 2021-11-29 DIAGNOSIS — E782 Mixed hyperlipidemia: Secondary | ICD-10-CM | POA: Diagnosis not present

## 2021-11-29 DIAGNOSIS — I10 Essential (primary) hypertension: Secondary | ICD-10-CM

## 2021-11-29 DIAGNOSIS — Z720 Tobacco use: Secondary | ICD-10-CM | POA: Diagnosis not present

## 2021-11-29 DIAGNOSIS — R072 Precordial pain: Secondary | ICD-10-CM

## 2021-11-29 DIAGNOSIS — N1832 Chronic kidney disease, stage 3b: Secondary | ICD-10-CM

## 2021-11-29 DIAGNOSIS — Z8249 Family history of ischemic heart disease and other diseases of the circulatory system: Secondary | ICD-10-CM

## 2021-11-29 NOTE — Progress Notes (Signed)
?  ?Cardiology Office Note ? ? ?Date:  11/29/2021  ? ?ID:  Bryan Webb, DOB Apr 30, 1971, MRN [id number] ? ?PCP:  Marrian Salvage, [place]  ? ? ?No chief complaint on file. ? ?Precordial chest pain ? ?Wt Readings from Last 3 Encounters:  ?11/29/21 203 lb (92.1 kg)  ?11/02/21 201 lb 12.8 oz (91.5 kg)  ?05/24/21 193 lb 3.2 oz (87.6 kg)  ?  ? ?  ?History of Present Illness: ?Bryan Webb is a 51 y.o. male who is being seen today for the evaluation of chest pain at the request of Marrian Salvage,*.  He has been a diabetic for 5 years.  ? ?Sharp pain, on the left side of the chest-lasting 1 to 2 hours. No associated  Nausea, lightheadedness, passing out, sweating. ? ?No prior heart testing. ? ?No family h/o CAD.  Hypertension,And diabetes runs in the family-parents have HTN, Father with DM and kidney disease. ? ?Works 3 jobs.  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  7K steps/day. No Chest pain or shortness of breath with walking. ? ?Smoking a pack every 3 days for 15 years.  Trying to quit.   ? ?Past Medical History:  ?Diagnosis Date  ? Arthritis   ? knee left   ? Chicken pox   ? Chronic kidney disease   ? Diabetes mellitus without complication ([place])   ? GERD (gastroesophageal reflux disease)   ? mild   ? Hyperlipidemia   ? Hypertension   ? Sleep apnea   ? has cpap- weras occ only   ? ? ?Past Surgical History:  ?Procedure Laterality Date  ? COLONOSCOPY  05/16/2020  ? DENTAL SURGERY    ? with sedation   ? ? ? ?Current Outpatient Medications  ?Medication Sig Dispense Refill  ? amLODipine (NORVASC) 10 MG tablet Take 1 tablet (10 mg total) by mouth daily. 90 tablet 3  ? cloNIDine (CATAPRES) 0.1 MG tablet Take 1 tablet (0.1 mg total) by mouth daily. 90 tablet 3  ? Continuous Blood Gluc Sensor (FREESTYLE LIBRE 2 SENSOR) MISC 2 Devices by Does not apply route every 14 (fourteen) days. 2 each 3  ? ezetimibe (ZETIA) 10 MG tablet Take 1 tablet (10 mg total) by mouth daily. 30 tablet 3  ? FARXIGA 10 MG TABS  tablet Take 10 mg by mouth daily.    ? Garlic 4742 MG CAPS Take by mouth.    ? losartan (COZAAR) 100 MG tablet Take 1 tablet (100 mg total) by mouth daily. 90 tablet 3  ? meloxicam (MOBIC) 15 MG tablet Take 1 tablet (15 mg total) by mouth daily. 30 tablet 0  ? metoprolol succinate (TOPROL-XL) 100 MG 24 hr tablet Take 1 tablet (100 mg total) by mouth daily. 90 tablet 3  ? OVER THE COUNTER MEDICATION Korean Ginseng over the counter 500 mg daily    ? repaglinide (PRANDIN) 0.5 MG tablet Take 1 tablet (0.5 mg total) by mouth daily before supper. 30 tablet 2  ? rosuvastatin (CRESTOR) 20 MG tablet Take 1 tablet (20 mg total) by mouth daily. 90 tablet 1  ? Semaglutide (RYBELSUS) 7 MG TABS TAKE 1 TABLET BY MOUTH ONCE DAILY 30  MINUTES  BEFORE  BREAKFAST  WITH  WATER 90 tablet 1  ? sildenafil (VIAGRA) 100 MG tablet Take 0.5 tablets (50 mg total) by mouth daily as needed. 10 tablet 3  ? Vitamin D, Ergocalciferol, (DRISDOL) 1.25 MG (50000 UNIT) CAPS capsule Take 50,000 Units by mouth once  a week.    ? ?No current facility-administered medications for this visit.  ? ? ?Allergies:   Patient has no known allergies.  ? ? ?Social History:  The patient  reports that he has been smoking cigarettes. He has a 5.00 pack-year smoking history. He has never used smokeless tobacco. He reports current alcohol use. He reports that he does not use drugs.  ? ?Family History:  The patient's family history includes Colon polyps in his father; Diabetes in his paternal grandmother; Hypertension in his father, maternal grandfather, maternal grandmother, mother, paternal grandfather, and paternal grandmother.  ? ? ?ROS:  Please see the history of present illness.   Otherwise, review of systems are positive for DM well controlled- started on insulin but transition to oral medicine.   All other systems are reviewed and negative.  ? ? ?PHYSICAL EXAM: ?VS:  BP 122/66   Pulse (!) 58   Ht '5\' 8"'$  (1.727 m)   Wt 203 lb (92.1 kg)   SpO2 98%   BMI 30.87  kg/m?  , BMI Body mass index is 30.87 kg/m?. ?GEN: Well nourished, well developed, in no acute distress ?HEENT: normal ?Neck: no JVD, carotid bruits, or masses ?Cardiac: RRR; no murmurs, rubs, or gallops,no edema  ?Respiratory:  clear to auscultation bilaterally, normal work of breathing ?GI: soft, nontender, nondistended, + BS ?MS: no deformity or atrophy ?Skin: warm and dry, no rash ?Neuro:  Strength and sensation are intact ?Psych: euthymic mood, full affect ? ? ?EKG:   ?The ekg ordered February 2023 showing sinus bradycardia nonspecific T wave flattening ? ? ?Recent Labs: ?02/22/2021: ALT 42; BUN 36; Creatinine, Ser 2.24; Potassium 4.4; Sodium 136  ? ?Lipid Panel ?   ?Component Value Date/Time  ? CHOL 271 (H) 08/24/2020 1538  ? TRIG 264.0 (H) 08/24/2020 1538  ? HDL 34.40 (L) 08/24/2020 1538  ? CHOLHDL 8 08/24/2020 1538  ? VLDL 52.8 (H) 08/24/2020 1538  ? [place] 76 11/23/2019 1558  ? LDLDIRECT 112.0 02/22/2021 1528  ? ?  ?Other studies Reviewed: ?Additional studies/ records that were reviewed today with results demonstrating: LDL 76. ? ? ?ASSESSMENT AND PLAN: ? ?Chest pain: Exercise nuclear stress test.  Not a candidate for CTA given his renal insufficiency. ?Diabetes: A1c 7.4.  Whole food, plant-based diet recommended. ?Tobacco abuse: He needs to stop smoking. ?Hyperlipidemia: LDL 76.  Well controlled. ?CRI: Cr 2.0. Follows with Dr. Marval Regal.  ?HTN: The current medical regimen is effective;  continue present plan and medications. ?FAmily h/o CAD: Father with stent.  Father is on HD ? ?Current medicines are reviewed at length with the patient today.  The patient concerns regarding his medicines were addressed. ? ?The following changes have been made:  No change ? ?Labs/ tests ordered today include:  ?No orders of the defined types were placed in this encounter. ? ? ?Recommend 150 minutes/week of aerobic exercise ?Low fat, low carb, high fiber diet recommended ? ?Disposition:   FU in 1 year and for stress test as  well ? ? ?Signed, ?Larae Grooms, MD  ?11/29/2021 3:54 PM    ?[facility] ?[address], [place], [place]  [address] ?Phone: [phone]; Fax: [phone]  ? ?

## 2021-11-29 NOTE — Patient Instructions (Signed)
Medication Instructions:  ?Your physician recommends that you continue on your current medications as directed. Please refer to the Current Medication list given to you today. ? ?*If you need a refill on your cardiac medications before your next appointment, please call your pharmacy* ? ? ?Lab Work: ?none ?If you have labs (blood work) drawn today and your tests are completely normal, you will receive your results only by: ?MyChart Message (if you have MyChart) OR ?A paper copy in the mail ?If you have any lab test that is abnormal or we need to change your treatment, we will call you to review the results. ? ? ?Testing/Procedures: ?Your physician has requested that you have en exercise stress myoview. For further information please visit [email or web address]. Please follow instruction sheet, as given. ? ? ? ?Follow-Up: ?At [facility] you and your health needs are our priority.  As part of our continuing mission to provide you with exceptional heart care, we have created designated Provider Care Teams.  These Care Teams include your primary Cardiologist (physician) and Advanced Practice Providers (APPs -  Physician Assistants and Nurse Practitioners) who all work together to provide you with the care you need, when you need it. ? ?We recommend signing up for the patient portal called "MyChart".  Sign up information is provided on this After Visit Summary.  MyChart is used to connect with patients for Virtual Visits (Telemedicine).  Patients are able to view lab/test results, encounter notes, upcoming appointments, etc.  Non-urgent messages can be sent to your provider as well.   ?To learn more about what you can do with MyChart, go to [email or web address].   ? ?Your next appointment:   ?12 month(s) ? ?The format for your next appointment:   ?In Person ? ?Provider:   ?Larae Grooms, MD   ? ? ?Other Instructions ? ? ?

## 2021-12-05 ENCOUNTER — Telehealth (HOSPITAL_COMMUNITY): Payer: Self-pay | Admitting: *Deleted

## 2021-12-05 NOTE — Telephone Encounter (Signed)
Patient given detailed instructions per Myocardial Perfusion Study Information Sheet for the test on 12/12/21 Patient notified to arrive 15 minutes early and that it is imperative to arrive on time for appointment to keep from having the test rescheduled. ? If you need to cancel or reschedule your appointment, please call the office within 24 hours of your appointment. . Patient verbalized understanding. Joclynn Lumb Jacqueline ? ? ?

## 2021-12-12 ENCOUNTER — Other Ambulatory Visit: Payer: Self-pay

## 2021-12-12 ENCOUNTER — Ambulatory Visit (HOSPITAL_COMMUNITY): Payer: BC Managed Care – PPO | Attending: Internal Medicine

## 2021-12-12 DIAGNOSIS — R072 Precordial pain: Secondary | ICD-10-CM | POA: Insufficient documentation

## 2021-12-12 LAB — MYOCARDIAL PERFUSION IMAGING
LV dias vol: 88 mL (ref 62–150)
LV sys vol: 44 mL
Nuc Stress EF: 50 %
Peak HR: 110 {beats}/min
Rest HR: 51 {beats}/min
Rest Nuclear Isotope Dose: 9.7 mCi
SDS: 0
SRS: 0
SSS: 0
ST Depression (mm): 0 mm
Stress Nuclear Isotope Dose: 30.4 mCi
T Wave inversion (mm): 2 mm
TID: 0.9

## 2021-12-12 MED ORDER — TECHNETIUM TC 99M TETROFOSMIN IV KIT
9.7000 | PACK | Freq: Once | INTRAVENOUS | Status: AC | PRN
  Administered 2021-12-12: 9.7 via INTRAVENOUS
  Filled 2021-12-12: qty 10

## 2021-12-12 MED ORDER — TECHNETIUM TC 99M TETROFOSMIN IV KIT
30.9000 | PACK | Freq: Once | INTRAVENOUS | Status: AC | PRN
  Administered 2021-12-12: 30.9 via INTRAVENOUS
  Filled 2021-12-12: qty 31

## 2021-12-12 MED ORDER — TECHNETIUM TC 99M TETROFOSMIN IV KIT
30.9000 | PACK | Freq: Once | INTRAVENOUS | Status: AC | PRN
  Filled 2021-12-12: qty 31

## 2021-12-12 MED ORDER — REGADENOSON 0.4 MG/5ML IV SOLN
0.4000 mg | Freq: Once | INTRAVENOUS | Status: AC
  Administered 2021-12-12: 0.4 mg via INTRAVENOUS

## 2022-01-19 ENCOUNTER — Other Ambulatory Visit: Payer: Self-pay | Admitting: Endocrinology

## 2022-01-19 ENCOUNTER — Other Ambulatory Visit: Payer: Self-pay | Admitting: Family

## 2022-02-25 ENCOUNTER — Other Ambulatory Visit: Payer: Self-pay | Admitting: Endocrinology

## 2022-04-16 ENCOUNTER — Other Ambulatory Visit (INDEPENDENT_AMBULATORY_CARE_PROVIDER_SITE_OTHER): Payer: BC Managed Care – PPO

## 2022-04-16 DIAGNOSIS — E782 Mixed hyperlipidemia: Secondary | ICD-10-CM | POA: Diagnosis not present

## 2022-04-16 DIAGNOSIS — E1165 Type 2 diabetes mellitus with hyperglycemia: Secondary | ICD-10-CM | POA: Diagnosis not present

## 2022-04-17 ENCOUNTER — Other Ambulatory Visit: Payer: BC Managed Care – PPO

## 2022-04-17 LAB — GLUCOSE, RANDOM: Glucose, Bld: 127 mg/dL — ABNORMAL HIGH (ref 70–99)

## 2022-04-17 LAB — LIPID PANEL
Cholesterol: 348 mg/dL — ABNORMAL HIGH (ref 0–200)
HDL: 37 mg/dL — ABNORMAL LOW (ref >39.00)
NonHDL: 310.52
Total CHOL/HDL Ratio: 9
Triglycerides: 297 mg/dL — ABNORMAL HIGH (ref 0.0–149.0)
VLDL: 59.4 mg/dL — ABNORMAL HIGH (ref 0.0–40.0)

## 2022-04-17 LAB — HEMOGLOBIN A1C: Hgb A1c MFr Bld: 8.2 % — ABNORMAL HIGH (ref 4.6–6.5)

## 2022-04-17 LAB — LDL CHOLESTEROL, DIRECT: Direct LDL: 226 mg/dL

## 2022-04-22 ENCOUNTER — Encounter: Payer: Self-pay | Admitting: Endocrinology

## 2022-04-22 ENCOUNTER — Ambulatory Visit: Payer: BC Managed Care – PPO | Admitting: Endocrinology

## 2022-04-22 VITALS — BP 144/102 | HR 69 | Ht 67.0 in | Wt 199.6 lb

## 2022-04-22 DIAGNOSIS — E1165 Type 2 diabetes mellitus with hyperglycemia: Secondary | ICD-10-CM

## 2022-04-22 DIAGNOSIS — E782 Mixed hyperlipidemia: Secondary | ICD-10-CM | POA: Diagnosis not present

## 2022-04-22 MED ORDER — RYBELSUS 14 MG PO TABS: 14.0000 mg | ORAL_TABLET | Freq: Every day | ORAL | 1 refills | Status: DC

## 2022-04-22 MED ORDER — EZETIMIBE 10 MG PO TABS: 10.0000 mg | ORAL_TABLET | Freq: Every day | ORAL | 1 refills | Status: DC

## 2022-04-22 MED ORDER — FREESTYLE LIBRE 3 SENSOR MISC: 1.0000 | 2 refills | Status: DC

## 2022-04-22 MED ORDER — REPAGLINIDE 0.5 MG PO TABS: 0.5000 mg | ORAL_TABLET | Freq: Every day | ORAL | 1 refills | Status: DC

## 2022-04-22 MED ORDER — ROSUVASTATIN CALCIUM 20 MG PO TABS: 20.0000 mg | ORAL_TABLET | Freq: Every day | ORAL | 1 refills | Status: DC

## 2022-04-22 NOTE — Progress Notes (Unsigned)
Patient ID: Bryan Webb, male   DOB: 04/05/1971, 51 y.o.   MRN: [id number]           Reason for Appointment: Follow-up    History of Present Illness:          Date of diagnosis of type 2 diabetes mellitus: 07/2016       Background history:   He was initially diagnosed at an emergency room when he presented with high blood sugars He was initially given Lantus insulin but he was switched to Trulicity by his PCP He says he had better blood sugars with Trulicity and his Z6S had gone down to 7.4 However not clear why he was put back on insulin after about 3 months and also Metformin He could not tolerate more than 1 tablet of 500 mg metformin ER  Recent history:   Most recent A1c is now 8.2 Fructosamine last 325  Non-insulin hypoglycemic drugs the patient is taking are: Rybelsus 7 mg daily, Prandin 0.5 mg at lunch and dinner  Current management, blood sugar patterns and problems identified: He has not been seen in follow-up since 9/22 Also he has been out of his Rybelsus for about 3 weeks recently as he had not obtain a refill Although he thinks he is usually taking his Prandin with lunch and dinner not clear how consistently he is He does not check his blood sugars with his fingerstick meter He says he did not have refills on his libre sensor and he did not check his sugar in several months His weight is slightly lower recently He does not think he is going off his diet Also not quite as active as before with working less Lab glucose was 127 late afternoon Has taken his Rybelsus in the morning, no nausea with 7 mg dose       Side effects from medications have been: Nausea from 14 mg Rybelsus     Typical meal intake: Breakfast is variable, usually 3 meals a day            Exercise: walks some at work    Glucose monitoring: Freestyle libre version 2  PREVIOUS data: Time in Target = 91%  PRE-MEAL Fasting Lunch Dinner Bedtime Overall  Glucose range:       Mean/median:  126 152 136  139   POST-MEAL PC Breakfast PC Lunch PC Dinner  Glucose range:     Mean/median: 151 166 122    Dietician visit, most recent: 12/2018  Weight history: Maximum 240  Wt Readings from Last 3 Encounters:  04/22/22 199 lb 9.6 oz (90.5 kg)  12/12/21 203 lb (92.1 kg)  11/29/21 203 lb (92.1 kg)    Glycemic control:   Lab Results  Component Value Date   HGBA1C 8.2 (H) 04/16/2022   HGBA1C 7.4 (H) 05/22/2021   HGBA1C 8.2 (H) 02/22/2021   Lab Results  Component Value Date   MICROALBUR 17.7 (H) 08/24/2020   LDLCALC 76 11/23/2019   CREATININE 2.24 (H) 02/22/2021   Lab Results  Component Value Date   MICRALBCREAT 10.0 08/24/2020    Lab Results  Component Value Date   FRUCTOSAMINE 280 05/22/2021   FRUCTOSAMINE 325 (H) 02/22/2021   FRUCTOSAMINE 311 (H) 03/29/2020    Lab on 04/16/2022  Component Date Value Ref Range Status   Glucose, Bld 04/16/2022 127 (H)  70 - 99 mg/dL Final   Cholesterol 04/16/2022 348 (H)  0 - 200 mg/dL Final   ATP III Classification  Desirable:  < 200 mg/dL               Borderline High:  200 - 239 mg/dL          High:  > = 240 mg/dL   Triglycerides 04/16/2022 297.0 (H)  0.0 - 149.0 mg/dL Final   Normal:  <150 mg/dLBorderline High:  150 - 199 mg/dL   HDL 04/16/2022 37.00 (L)  >39.00 mg/dL Final   VLDL 04/16/2022 59.4 (H)  0.0 - 40.0 mg/dL Final   Total CHOL/HDL Ratio 04/16/2022 9   Final                  Men          Women1/2 Average Risk     3.4          3.3Average Risk          5.0          4.42X Average Risk          9.6          7.13X Average Risk          15.0          11.0                       NonHDL 04/16/2022 310.52   Final   NOTE:  Non-HDL goal should be 30 mg/dL higher than patient's LDL goal (i.e. LDL goal of < 70 mg/dL, would have non-HDL goal of < 100 mg/dL)   Hgb A1c MFr Bld 04/16/2022 8.2 (H)  4.6 - 6.5 % Final   Glycemic Control Guidelines for People with Diabetes:Non Diabetic:  <6%Goal of Therapy: <7%Additional Action  Suggested:  >8%    Direct LDL 04/16/2022 226.0  mg/dL Final   Optimal:  <100 mg/dLNear or Above Optimal:  100-129 mg/dLBorderline High:  130-159 mg/dLHigh:  160-189 mg/dLVery High:  >190 mg/dL    Allergies as of 04/22/2022   No Known Allergies      Medication List        Accurate as of April 22, 2022  3:40 PM. If you have any questions, ask your nurse or doctor.          amLODipine 10 MG tablet Commonly known as: NORVASC Take 1 tablet (10 mg total) by mouth daily.   cloNIDine 0.1 MG tablet Commonly known as: CATAPRES Take 1 tablet (0.1 mg total) by mouth daily.   ezetimibe 10 MG tablet Commonly known as: Zetia Take 1 tablet (10 mg total) by mouth daily.   Farxiga 10 MG Tabs tablet Generic drug: dapagliflozin propanediol Take 10 mg by mouth daily.   FreeStyle Libre 2 Sensor Misc 2 Devices by Does not apply route every 14 (fourteen) days.   Garlic 7124 MG Caps Take by mouth.   losartan 100 MG tablet Commonly known as: COZAAR Take 1 tablet (100 mg total) by mouth daily.   meloxicam 15 MG tablet Commonly known as: MOBIC Take 1 tablet (15 mg total) by mouth daily.   metoprolol succinate 100 MG 24 hr tablet Commonly known as: TOPROL-XL Take 1 tablet (100 mg total) by mouth daily.   OVER THE COUNTER MEDICATION Korean Ginseng over the counter 500 mg daily   repaglinide 0.5 MG tablet Commonly known as: PRANDIN Take 1 tablet (0.5 mg total) by mouth daily before supper.   rosuvastatin 20 MG tablet Commonly known as: CRESTOR Take 1 tablet (20 mg total) by mouth daily.  Rybelsus 7 MG Tabs Generic drug: Semaglutide TAKE 1 TABLET BY MOUTH ONCE DAILY 30 MIN BEFORE BREAKFAST WITH WATER   sildenafil 100 MG tablet Commonly known as: VIAGRA TAKE 1/2 (ONE-HALF) TABLET BY MOUTH ONCE DAILY AS NEEDED   Vitamin D (Ergocalciferol) 1.25 MG (50000 UNIT) Caps capsule Commonly known as: DRISDOL Take 50,000 Units by mouth once a week.        Allergies: No Known  Allergies  Past Medical History:  Diagnosis Date   Arthritis    knee left    Chicken pox    Chronic kidney disease    Diabetes mellitus without complication (HCC)    GERD (gastroesophageal reflux disease)    mild    Hyperlipidemia    Hypertension    Sleep apnea    has cpap- weras occ only     Past Surgical History:  Procedure Laterality Date   COLONOSCOPY  05/16/2020   DENTAL SURGERY     with sedation     Family History  Problem Relation Age of Onset   Hypertension Mother    Hypertension Father    Colon polyps Father    Hypertension Maternal Grandmother    Hypertension Maternal Grandfather    Hypertension Paternal Grandmother    Diabetes Paternal Grandmother    Hypertension Paternal Grandfather    Colon cancer Neg Hx    Esophageal cancer Neg Hx    Rectal cancer Neg Hx    Stomach cancer Neg Hx     Social History:  reports that he has been smoking cigarettes. He has a 5.00 pack-year smoking history. He has never used smokeless tobacco. He reports current alcohol use. He reports that he does not use drugs.   Review of Systems   Lipid history: He was started on Crestor 20 mg when he was not tolerating Lipitor Also has been prescribed Zetia previously but not taking this again He ran out of rosuvastatin prescription a few months ago and did not call for refill  Previously LDL with the combination was below 100, but now his lipids are markedly increased  Triglycerides are high also  Lab Results  Component Value Date   CHOL 348 (H) 04/16/2022   CHOL 271 (H) 08/24/2020   CHOL 141 11/23/2019   Lab Results  Component Value Date   HDL 37.00 (L) 04/16/2022   HDL 34.40 (L) 08/24/2020   HDL 35.00 (L) 11/23/2019   Lab Results  Component Value Date   LDLCALC 76 11/23/2019   LDLCALC 131 (H) 05/18/2019   LDLCALC 111 (H) 02/01/2019   Lab Results  Component Value Date   TRIG 297.0 (H) 04/16/2022   TRIG 264.0 (H) 08/24/2020   TRIG 150.0 (H) 11/23/2019   Lab  Results  Component Value Date   CHOLHDL 9 04/16/2022   CHOLHDL 8 08/24/2020   CHOLHDL 4 11/23/2019   Lab Results  Component Value Date   LDLDIRECT 226.0 04/16/2022   LDLDIRECT 112.0 02/22/2021   LDLDIRECT 180.0 08/24/2020            Hypertension: Has been treated with losartan 100 mg, clonidine 0.1, metoprolol 100 mg and amlodipine 10 mg daily On treatment for several years  He is followed by nephrologist   BP Readings from Last 3 Encounters:  04/22/22 (!) 144/102  11/29/21 122/66  11/02/21 (!) 174/96    Most recent eye exam was in 2/23  Most recent foot exam: 10/2018  Currently known complications of diabetes: Erectile dysfunction  Chronic kidney disease: He was told  his renal function is slightly worse now   Lab Results  Component Value Date   CREATININE 2.24 (H) 02/22/2021   CREATININE 2.17 (H) 08/24/2020   CREATININE 2.65 (H) 03/29/2020   Reportedly has fatty liver with persistently high liver functions, last better than before  Lab Results  Component Value Date   ALT 42 02/22/2021     LABS:  Lab on 04/16/2022  Component Date Value Ref Range Status   Glucose, Bld 04/16/2022 127 (H)  70 - 99 mg/dL Final   Cholesterol 04/16/2022 348 (H)  0 - 200 mg/dL Final   ATP III Classification       Desirable:  < 200 mg/dL               Borderline High:  200 - 239 mg/dL          High:  > = 240 mg/dL   Triglycerides 04/16/2022 297.0 (H)  0.0 - 149.0 mg/dL Final   Normal:  <150 mg/dLBorderline High:  150 - 199 mg/dL   HDL 04/16/2022 37.00 (L)  >39.00 mg/dL Final   VLDL 04/16/2022 59.4 (H)  0.0 - 40.0 mg/dL Final   Total CHOL/HDL Ratio 04/16/2022 9   Final                  Men          Women1/2 Average Risk     3.4          3.3Average Risk          5.0          4.42X Average Risk          9.6          7.13X Average Risk          15.0          11.0                       NonHDL 04/16/2022 310.52   Final   NOTE:  Non-HDL goal should be 30 mg/dL higher than patient's LDL  goal (i.e. LDL goal of < 70 mg/dL, would have non-HDL goal of < 100 mg/dL)   Hgb A1c MFr Bld 04/16/2022 8.2 (H)  4.6 - 6.5 % Final   Glycemic Control Guidelines for People with Diabetes:Non Diabetic:  <6%Goal of Therapy: <7%Additional Action Suggested:  >8%    Direct LDL 04/16/2022 226.0  mg/dL Final   Optimal:  <100 mg/dLNear or Above Optimal:  100-129 mg/dLBorderline High:  130-159 mg/dLHigh:  160-189 mg/dLVery High:  >190 mg/dL    Physical Examination:  BP (!) 144/102   Pulse 69   Ht '5\' 7"'$  (1.702 m)   Wt 199 lb 9.6 oz (90.5 kg)   SpO2 99%   BMI 31.26 kg/m       ASSESSMENT:  Diabetes type 2, and obesity  See history of present illness for detailed discussion of current diabetes management, blood sugar patterns and problems identified  His A1c is back up to 8.2, previously was at 7.4  Without monitoring his blood sugars using freestyle libre he is likely not watching his diet consistently Also may not be taking his Prandin consistently Recently also ran out of Rybelsus Overall however has maintained his weight  HYPERTENSION: Blood pressure is high to be followed by nephrology  LIPIDS: Poorly controlled from not taking his medications   PLAN:    Continue 0.5 mg Prandin as before at lunch and dinnertime  Restart freestyle libre and coupon given for [employer] 3 with single care If he is getting high readings after certain meals will increase Prandin  RYBELSUS will be increased to 14 mg, if he has any nausea he will need to eat a snack 30 minutes later Walk for exercise Restart Crestor and Zetia   Follow-up lipids on the next visit also   There are no Patient Instructions on file for this visit.       Elayne Snare 04/22/2022, 3:40 PM   Note: This office note was prepared with Dragon voice recognition system technology. Any transcriptional errors that result from this process are unintentional.

## 2022-04-22 NOTE — Patient Instructions (Addendum)
Check blood sugars on waking up 2-3 days a week  Also check blood sugars about 2 hours after meals and do this after different meals by rotation  Recommended blood sugar levels on waking up are 90-130 and about 2 hours after meal is 130-180  Please bring your blood sugar monitor to each visit, thank you  Take 2 of the '7mg'$  Rybelsus in am

## 2022-05-13 ENCOUNTER — Other Ambulatory Visit: Payer: Self-pay | Admitting: Family

## 2022-07-12 ENCOUNTER — Other Ambulatory Visit: Payer: Self-pay

## 2022-08-05 ENCOUNTER — Other Ambulatory Visit: Payer: BC Managed Care – PPO

## 2022-08-12 ENCOUNTER — Ambulatory Visit: Payer: BC Managed Care – PPO | Admitting: Endocrinology

## 2022-08-12 ENCOUNTER — Other Ambulatory Visit: Payer: Self-pay | Admitting: Family

## 2022-08-12 NOTE — Progress Notes (Incomplete)
Patient ID: Bryan Webb, male   DOB: 13-Jun-1971, 51 y.o.   MRN: [id number]           Reason for Appointment: Follow-up    History of Present Illness:          Date of diagnosis of type 2 diabetes mellitus: 07/2016       Background history:   He was initially diagnosed at an emergency room when he presented with high blood sugars He was initially given Lantus insulin but he was switched to Trulicity by his PCP He says he had better blood sugars with Trulicity and his V2Z had gone down to 7.4 However not clear why he was put back on insulin after about 3 months and also Metformin He could not tolerate more than 1 tablet of 500 mg metformin ER  Recent history:   Most recent A1c is now 8.2 Fructosamine last 325  Non-insulin hypoglycemic drugs the patient is taking are: Rybelsus 7 mg daily, Prandin 0.5 mg at lunch and dinner  Current management, blood sugar patterns and problems identified: He has not been seen in follow-up since 9/22 Also he has been out of his Rybelsus for about 3 weeks recently as he had not obtain a refill Although he thinks he is usually taking his Prandin with lunch and dinner not clear how consistently he is He does not check his blood sugars with his fingerstick meter He says he did not have refills on his libre sensor and he did not check his sugar in several months His weight is slightly lower recently He does not think he is going off his diet Also not quite as active as before with working less Lab glucose was 127 late afternoon Has taken his Rybelsus in the morning, no nausea with 7 mg dose       Side effects from medications have been: Nausea from 14 mg Rybelsus     Typical meal intake: Breakfast is variable, usually 3 meals a day            Exercise: walks some at work    Glucose monitoring: Freestyle libre version 2  PREVIOUS data: Time in Target = 91%  PRE-MEAL Fasting Lunch Dinner Bedtime Overall  Glucose range:       Mean/median:  126 152 136  139   POST-MEAL PC Breakfast PC Lunch PC Dinner  Glucose range:     Mean/median: 151 166 122    Dietician visit, most recent: 12/2018  Weight history: Maximum 240  Wt Readings from Last 3 Encounters:  04/22/22 199 lb 9.6 oz (90.5 kg)  12/12/21 203 lb (92.1 kg)  11/29/21 203 lb (92.1 kg)    Glycemic control:   Lab Results  Component Value Date   HGBA1C 8.2 (H) 04/16/2022   HGBA1C 7.4 (H) 05/22/2021   HGBA1C 8.2 (H) 02/22/2021   Lab Results  Component Value Date   MICROALBUR 17.7 (H) 08/24/2020   LDLCALC 76 11/23/2019   CREATININE 2.24 (H) 02/22/2021   Lab Results  Component Value Date   MICRALBCREAT 10.0 08/24/2020    Lab Results  Component Value Date   FRUCTOSAMINE 280 05/22/2021   FRUCTOSAMINE 325 (H) 02/22/2021   FRUCTOSAMINE 311 (H) 03/29/2020    No visits with results within 1 Week(s) from this visit.  Latest known visit with results is:  Lab on 04/16/2022  Component Date Value Ref Range Status  . Glucose, Bld 04/16/2022 127 (H)  70 - 99 mg/dL Final  . Cholesterol  04/16/2022 348 (H)  0 - 200 mg/dL Final   ATP III Classification       Desirable:  < 200 mg/dL               Borderline High:  200 - 239 mg/dL          High:  > = 240 mg/dL  . Triglycerides 04/16/2022 297.0 (H)  0.0 - 149.0 mg/dL Final   Normal:  <150 mg/dLBorderline High:  150 - 199 mg/dL  . HDL 04/16/2022 37.00 (L)  >39.00 mg/dL Final  . VLDL 04/16/2022 59.4 (H)  0.0 - 40.0 mg/dL Final  . Total CHOL/HDL Ratio 04/16/2022 9   Final                  Men          Women1/2 Average Risk     3.4          3.3Average Risk          5.0          4.42X Average Risk          9.6          7.13X Average Risk          15.0          11.0                      . NonHDL 04/16/2022 310.52   Final   NOTE:  Non-HDL goal should be 30 mg/dL higher than patient's LDL goal (i.e. LDL goal of < 70 mg/dL, would have non-HDL goal of < 100 mg/dL)  . Hgb A1c MFr Bld 04/16/2022 8.2 (H)  4.6 - 6.5 % Final    Glycemic Control Guidelines for People with Diabetes:Non Diabetic:  <6%Goal of Therapy: <7%Additional Action Suggested:  >8%   . Direct LDL 04/16/2022 226.0  mg/dL Final   Optimal:  <100 mg/dLNear or Above Optimal:  100-129 mg/dLBorderline High:  130-159 mg/dLHigh:  160-189 mg/dLVery High:  >190 mg/dL    Allergies as of 08/12/2022   No Known Allergies      Medication List        Accurate as of August 12, 2022  9:22 AM. If you have any questions, ask your nurse or doctor.          amLODipine 10 MG tablet Commonly known as: NORVASC Take 1 tablet (10 mg total) by mouth daily.   cloNIDine 0.1 MG tablet Commonly known as: CATAPRES Take 1 tablet (0.1 mg total) by mouth daily.   ezetimibe 10 MG tablet Commonly known as: Zetia Take 1 tablet (10 mg total) by mouth daily.   Farxiga 10 MG Tabs tablet Generic drug: dapagliflozin propanediol Take 10 mg by mouth daily.   FreeStyle Libre 3 Sensor Misc 1 Device by Does not apply route every 14 (fourteen) days. Apply 1 sensor on upper arm every 14 days for continuous glucose monitoring   Garlic 7654 MG Caps Take by mouth.   losartan 100 MG tablet Commonly known as: COZAAR Take 1 tablet (100 mg total) by mouth daily.   meloxicam 15 MG tablet Commonly known as: MOBIC Take 1 tablet (15 mg total) by mouth daily.   metoprolol succinate 100 MG 24 hr tablet Commonly known as: TOPROL-XL Take 1 tablet (100 mg total) by mouth daily.   OVER THE COUNTER MEDICATION Korean Ginseng over the counter 500 mg daily   repaglinide 0.5 MG tablet Commonly  known as: PRANDIN Take 1 tablet (0.5 mg total) by mouth daily before supper.   rosuvastatin 20 MG tablet Commonly known as: CRESTOR Take 1 tablet (20 mg total) by mouth daily.   Rybelsus 14 MG Tabs Generic drug: Semaglutide Take 1 tablet (14 mg total) by mouth daily.   sildenafil 100 MG tablet Commonly known as: VIAGRA TAKE 1/2 (ONE-HALF) TABLET BY MOUTH ONCE DAILY AS NEEDED    Vitamin D (Ergocalciferol) 1.25 MG (50000 UNIT) Caps capsule Commonly known as: DRISDOL Take 50,000 Units by mouth once a week.        Allergies: No Known Allergies  Past Medical History:  Diagnosis Date  . Arthritis    knee left   . Chicken pox   . Chronic kidney disease   . Diabetes mellitus without complication ([place])   . GERD (gastroesophageal reflux disease)    mild   . Hyperlipidemia   . Hypertension   . Sleep apnea    has cpap- weras occ only     Past Surgical History:  Procedure Laterality Date  . COLONOSCOPY  05/16/2020  . DENTAL SURGERY     with sedation     Family History  Problem Relation Age of Onset  . Hypertension Mother   . Hypertension Father   . Colon polyps Father   . Hypertension Maternal Grandmother   . Hypertension Maternal Grandfather   . Hypertension Paternal Grandmother   . Diabetes Paternal Grandmother   . Hypertension Paternal Grandfather   . Colon cancer Neg Hx   . Esophageal cancer Neg Hx   . Rectal cancer Neg Hx   . Stomach cancer Neg Hx     Social History:  reports that he has been smoking cigarettes. He has a 5.00 pack-year smoking history. He has never used smokeless tobacco. He reports current alcohol use. He reports that he does not use drugs.   Review of Systems   Lipid history: He was started on Crestor 20 mg when he was not tolerating Lipitor Also has been prescribed Zetia previously but not taking this again He ran out of rosuvastatin prescription a few months ago and did not call for refill  Previously LDL with the combination was below 100, but now his lipids are markedly increased  Triglycerides are high also  Lab Results  Component Value Date   CHOL 348 (H) 04/16/2022   CHOL 271 (H) 08/24/2020   CHOL 141 11/23/2019   Lab Results  Component Value Date   HDL 37.00 (L) 04/16/2022   HDL 34.40 (L) 08/24/2020   HDL 35.00 (L) 11/23/2019   Lab Results  Component Value Date   LDLCALC 76 11/23/2019    LDLCALC 131 (H) 05/18/2019   LDLCALC 111 (H) 02/01/2019   Lab Results  Component Value Date   TRIG 297.0 (H) 04/16/2022   TRIG 264.0 (H) 08/24/2020   TRIG 150.0 (H) 11/23/2019   Lab Results  Component Value Date   CHOLHDL 9 04/16/2022   CHOLHDL 8 08/24/2020   CHOLHDL 4 11/23/2019   Lab Results  Component Value Date   LDLDIRECT 226.0 04/16/2022   LDLDIRECT 112.0 02/22/2021   LDLDIRECT 180.0 08/24/2020            Hypertension: Has been treated with losartan 100 mg, clonidine 0.1, metoprolol 100 mg and amlodipine 10 mg daily On treatment for several years  He is followed by nephrologist   BP Readings from Last 3 Encounters:  04/22/22 (!) 144/102  11/29/21 122/66  11/02/21 (!) 174/96  Most recent eye exam was in 2/23  Most recent foot exam: 10/2018  Currently known complications of diabetes: Erectile dysfunction  Chronic kidney disease: He was told his renal function is slightly worse now   Lab Results  Component Value Date   CREATININE 2.24 (H) 02/22/2021   CREATININE 2.17 (H) 08/24/2020   CREATININE 2.65 (H) 03/29/2020   Reportedly has fatty liver with persistently high liver functions, last better than before  Lab Results  Component Value Date   ALT 42 02/22/2021     LABS:  No visits with results within 1 Week(s) from this visit.  Latest known visit with results is:  Lab on 04/16/2022  Component Date Value Ref Range Status  . Glucose, Bld 04/16/2022 127 (H)  70 - 99 mg/dL Final  . Cholesterol 04/16/2022 348 (H)  0 - 200 mg/dL Final   ATP III Classification       Desirable:  < 200 mg/dL               Borderline High:  200 - 239 mg/dL          High:  > = 240 mg/dL  . Triglycerides 04/16/2022 297.0 (H)  0.0 - 149.0 mg/dL Final   Normal:  <150 mg/dLBorderline High:  150 - 199 mg/dL  . HDL 04/16/2022 37.00 (L)  >39.00 mg/dL Final  . VLDL 04/16/2022 59.4 (H)  0.0 - 40.0 mg/dL Final  . Total CHOL/HDL Ratio 04/16/2022 9   Final                  Men           Women1/2 Average Risk     3.4          3.3Average Risk          5.0          4.42X Average Risk          9.6          7.13X Average Risk          15.0          11.0                      . NonHDL 04/16/2022 310.52   Final   NOTE:  Non-HDL goal should be 30 mg/dL higher than patient's LDL goal (i.e. LDL goal of < 70 mg/dL, would have non-HDL goal of < 100 mg/dL)  . Hgb A1c MFr Bld 04/16/2022 8.2 (H)  4.6 - 6.5 % Final   Glycemic Control Guidelines for People with Diabetes:Non Diabetic:  <6%Goal of Therapy: <7%Additional Action Suggested:  >8%   . Direct LDL 04/16/2022 226.0  mg/dL Final   Optimal:  <100 mg/dLNear or Above Optimal:  100-129 mg/dLBorderline High:  130-159 mg/dLHigh:  160-189 mg/dLVery High:  >190 mg/dL    Physical Examination:  There were no vitals taken for this visit.      ASSESSMENT:  Diabetes type 2, and obesity  See history of present illness for detailed discussion of current diabetes management, blood sugar patterns and problems identified  His A1c is back up to 8.2, previously was at 7.4  Without monitoring his blood sugars using freestyle libre he is likely not watching his diet consistently Also may not be taking his Prandin consistently Recently also ran out of Rybelsus Overall however has maintained his weight  HYPERTENSION: Blood pressure is high to be followed by nephrology  LIPIDS: Poorly controlled  from not taking his medications   PLAN:    Continue 0.5 mg Prandin as before at lunch and dinnertime Restart freestyle libre and coupon given for [employer] 3 with single care If he is getting high readings after certain meals will increase Prandin  RYBELSUS will be increased to 14 mg, if he has any nausea he will need to eat a snack 30 minutes later Walk for exercise Restart Crestor and Zetia   Follow-up lipids on the next visit also   There are no Patient Instructions on file for this visit.       Elayne Snare 08/12/2022, 9:22 AM   Note:  This office note was prepared with Dragon voice recognition system technology. Any transcriptional errors that result from this process are unintentional.

## 2022-11-13 ENCOUNTER — Other Ambulatory Visit: Payer: Self-pay | Admitting: Family

## 2022-12-06 IMAGING — DX DG KNEE COMPLETE 4+V*L*
4 series · 4 of 4 positions shown · non-contrast
Comparison: None.

CLINICAL DATA: Chronic left knee pain and stiffness.

EXAM:
LEFT KNEE - COMPLETE 4+ VIEW

[knee ap]
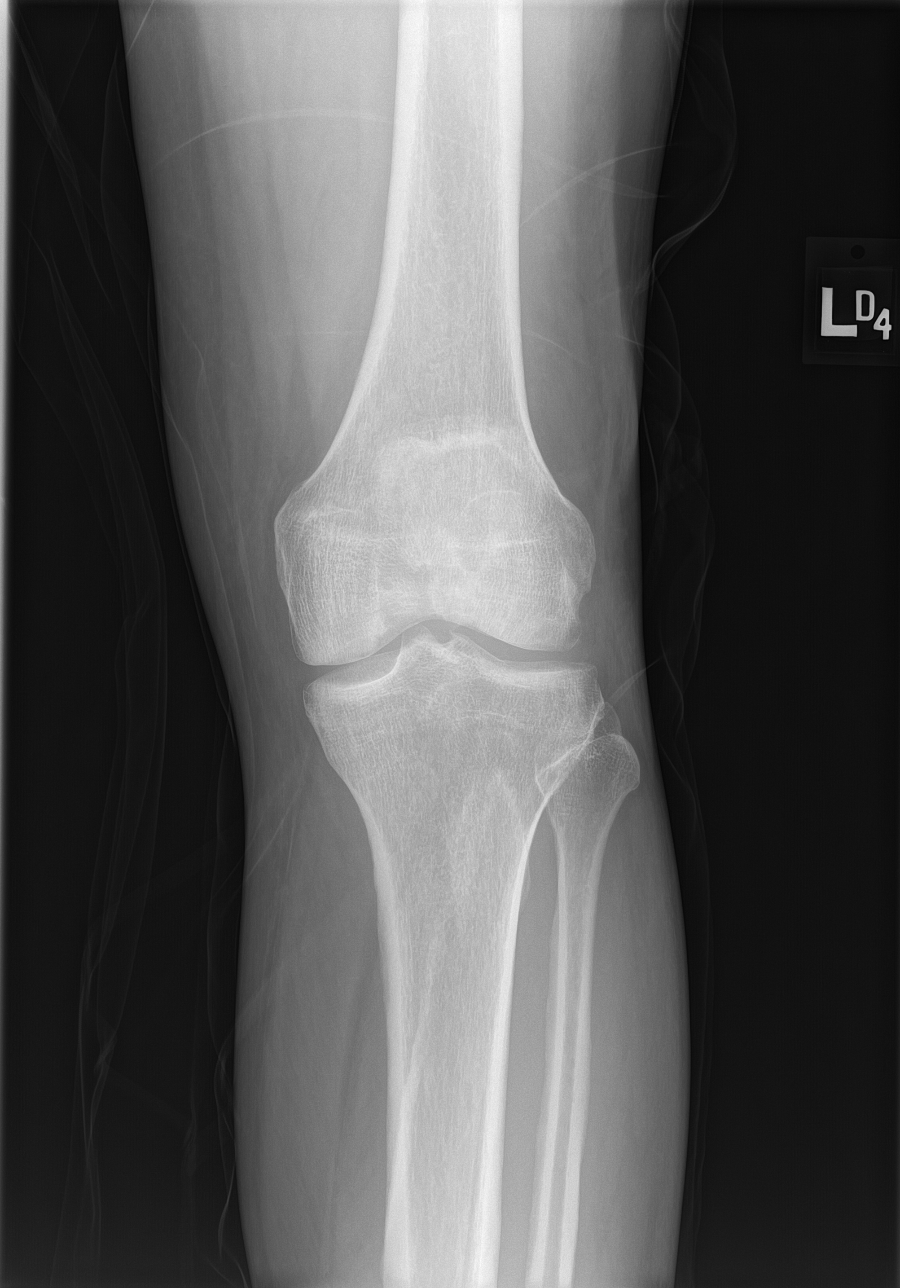

[knee obl (1 of 2)]
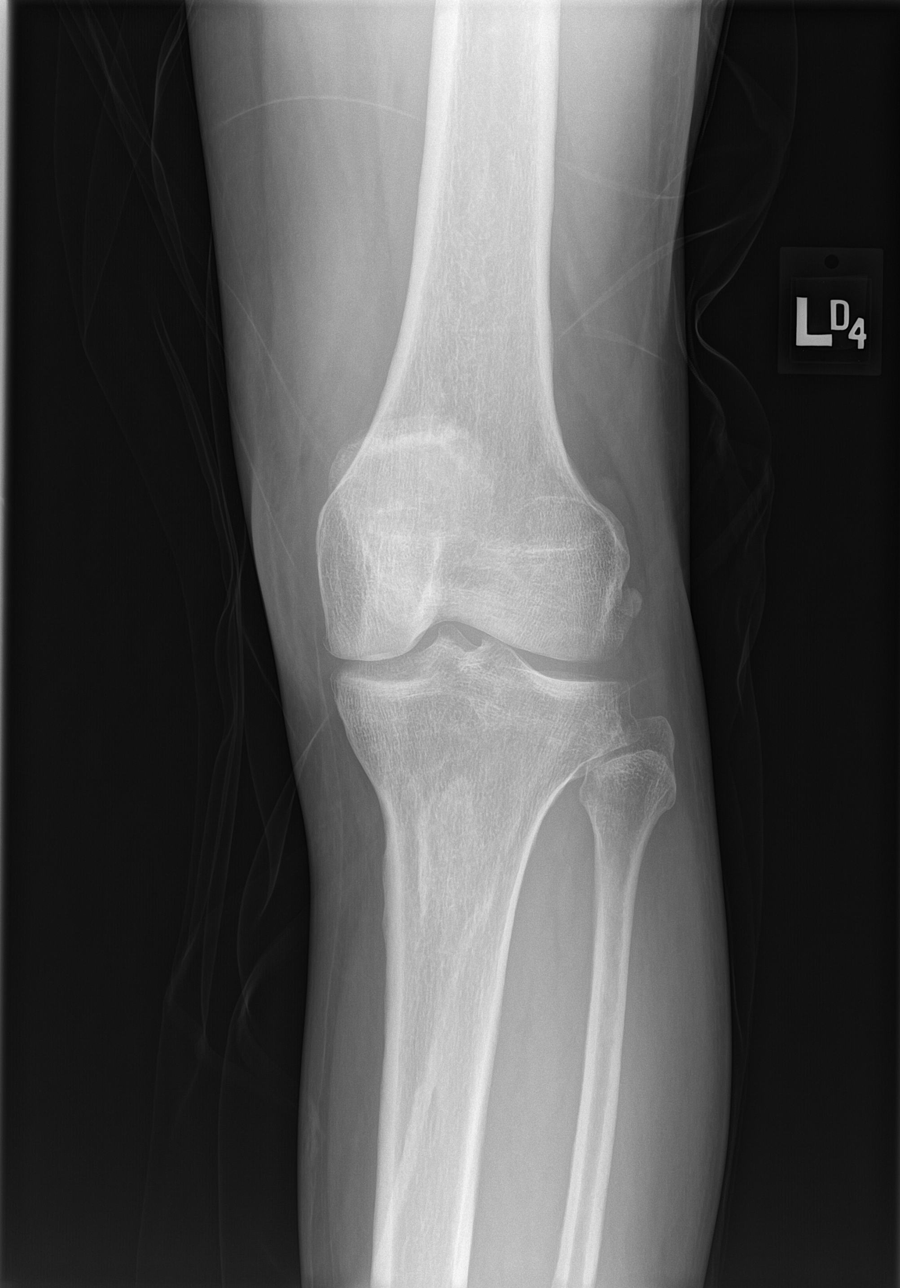

[knee obl (2 of 2)]
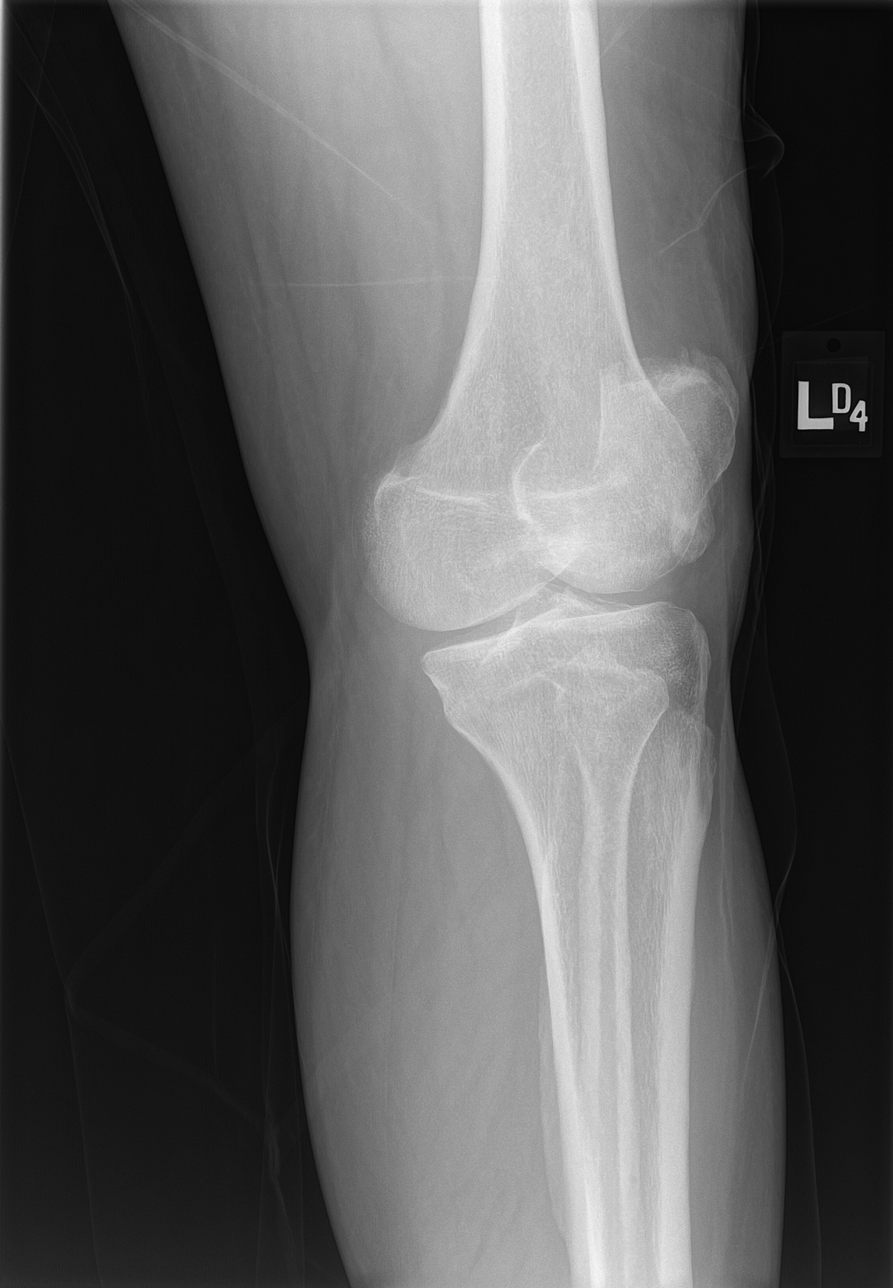

[knee lat]
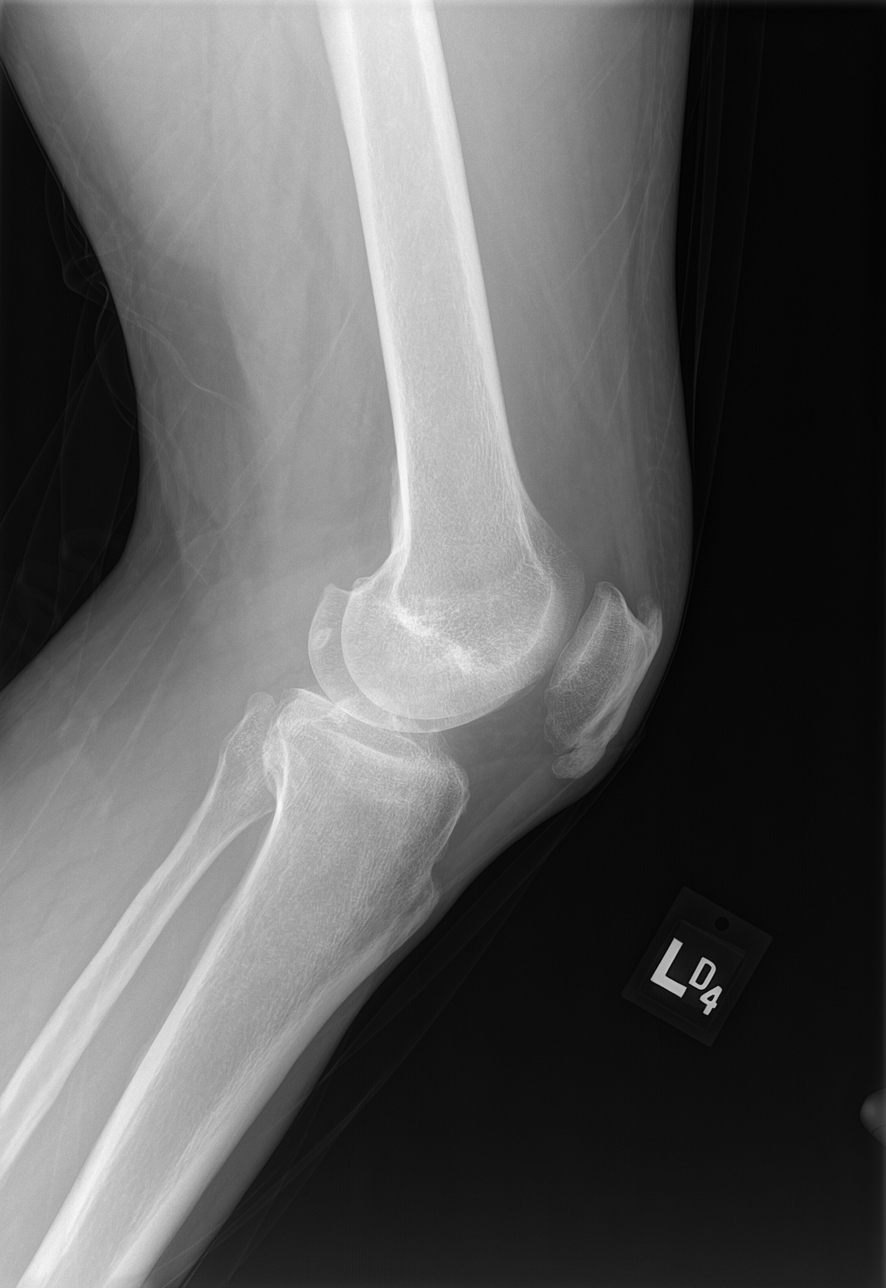

[4 of 4 positions shown; findings below may reference images not displayed]

FINDINGS: No evidence of fracture, dislocation, or joint effusion. No evidence
of arthropathy or other focal bone abnormality. There is some
spurring about the superior and inferior poles of the patella. Soft
tissues are unremarkable.
IMPRESSION: Spurring about the patella.  Otherwise negative.

## 2022-12-11 ENCOUNTER — Encounter: Payer: Self-pay | Admitting: Endocrinology

## 2022-12-11 ENCOUNTER — Ambulatory Visit: Payer: BC Managed Care – PPO | Admitting: Endocrinology

## 2022-12-11 VITALS — BP 158/96 | HR 70 | Ht 68.0 in | Wt 205.0 lb

## 2022-12-11 DIAGNOSIS — E782 Mixed hyperlipidemia: Secondary | ICD-10-CM | POA: Diagnosis not present

## 2022-12-11 DIAGNOSIS — E1165 Type 2 diabetes mellitus with hyperglycemia: Secondary | ICD-10-CM | POA: Diagnosis not present

## 2022-12-11 LAB — LIPID PANEL
Cholesterol: 210 mg/dL — ABNORMAL HIGH (ref 0–200)
HDL: 40 mg/dL (ref >39.00)
LDL Cholesterol: 138 mg/dL — ABNORMAL HIGH (ref 0–99)
NonHDL: 170.11
Total CHOL/HDL Ratio: 5
Triglycerides: 162 mg/dL — ABNORMAL HIGH (ref 0.0–149.0)
VLDL: 32.4 mg/dL (ref 0.0–40.0)

## 2022-12-11 LAB — POCT GLYCOSYLATED HEMOGLOBIN (HGB A1C): Hemoglobin A1C: 7.1 % — AB (ref 4.0–5.6)

## 2022-12-11 LAB — COMPREHENSIVE METABOLIC PANEL
ALT: 51 U/L (ref 0–53)
AST: 54 U/L — ABNORMAL HIGH (ref 0–37)
Albumin: 4 g/dL (ref 3.5–5.2)
Alkaline Phosphatase: 187 U/L — ABNORMAL HIGH (ref 39–117)
BUN: 35 mg/dL — ABNORMAL HIGH (ref 6–23)
CO2: 26 mEq/L (ref 19–32)
Calcium: 9.5 mg/dL (ref 8.4–10.5)
Chloride: 104 mEq/L (ref 96–112)
Creatinine, Ser: 2.6 mg/dL — ABNORMAL HIGH (ref 0.40–1.50)
GFR: 27.35 mL/min — ABNORMAL LOW (ref >60.00)
Glucose, Bld: 149 mg/dL — ABNORMAL HIGH (ref 70–99)
Potassium: 4.5 mEq/L (ref 3.5–5.1)
Sodium: 136 mEq/L (ref 135–145)
Total Bilirubin: 0.5 mg/dL (ref 0.2–1.2)
Total Protein: 7.8 g/dL (ref 6.0–8.3)

## 2022-12-11 MED ORDER — RYBELSUS 14 MG PO TABS: 14.0000 mg | ORAL_TABLET | Freq: Every day | ORAL | 1 refills | Status: DC

## 2022-12-11 MED ORDER — ROSUVASTATIN CALCIUM 20 MG PO TABS: 20.0000 mg | ORAL_TABLET | Freq: Every day | ORAL | 1 refills | Status: DC

## 2022-12-11 MED ORDER — EZETIMIBE 10 MG PO TABS: 10.0000 mg | ORAL_TABLET | Freq: Every day | ORAL | 1 refills | Status: DC

## 2022-12-11 NOTE — Progress Notes (Signed)
Patient ID: Bryan Webb, male   DOB: 1970-10-28, 52 y.o.   MRN: [id number]           Reason for Appointment: Follow-up    History of Present Illness:          Date of diagnosis of type 2 diabetes mellitus: 07/2016       Background history:   He was initially diagnosed at an emergency room when he presented with high blood sugars He was initially given Lantus insulin but he was switched to Trulicity by his PCP He says he had better blood sugars with Trulicity and his [id number] had gone down to 7.4 However not clear why he was put back on insulin after about 3 months and also Metformin He could not tolerate more than 1 tablet of 500 mg metformin ER  Recent history:   Most recent A1c is now 7.1 Fructosamine last 325  Non-insulin hypoglycemic drugs the patient is taking are: Rybelsus 7 mg daily, Prandin 0.5 mg at lunch and dinner  Current management, blood sugar patterns and problems identified: He was supposed to go up to 14 mg on the Rybelsus but he still getting refills on 7 mg Again has been irregular with his follow-up and has not been back since 8/23 With his CGM his blood sugars appear to be consistently around 140-150 most of the day and night  He is however able to use the freestyle libre 3 now  He is supposed to be taking Prandin before lunch and dinner but generally does not remember to do this He may have been given [place] by another physician but he is not taking this now He said that once he took it later in the evening after dinner and his blood sugar felt like it was dropping  However his blood sugars are not higher after meals lately  His weight is somewhat variable but slightly higher than before Not doing much consistent exercise lately       Side effects from medications have been: Nausea from 14 mg Rybelsus     Typical meal intake: Breakfast is variable, usually 3 meals a day            Exercise: walks at work    Glucose monitoring: Freestyle libre version  3  Interpretation of download  Blood sugars have been monitored only intermittently and no data between 3/20 and 3/26 OVERNIGHT blood sugars are fairly stable in the 140-150 range Blood sugars after meals are not showing any significant rise with highest readings around midnight but Premeal readings are still in the 130-140 range in the evenings No hypoglycemia Blood sugar variability is minimal Only has sporadic hypoglycemic episodes in the morning hours Recent GMI 6.8  CGM use % of time 46  2-week average/GV 147  Time in range 91  % Time Above 180 9  % Time above 250   % Time Below 70       PREVIOUS data: Time in Target = 91%  PRE-MEAL Fasting Lunch Dinner Bedtime Overall  Glucose range:       Mean/median: 126 152 136  139   POST-MEAL PC Breakfast PC Lunch PC Dinner  Glucose range:     Mean/median: 151 166 122    Dietician visit, most recent: 12/2018  Weight history: Maximum 240  Wt Readings from Last 3 Encounters:  12/11/22 205 lb (93 kg)  04/22/22 199 lb 9.6 oz (90.5 kg)  12/12/21 203 lb (92.1 kg)    Glycemic control:  Lab Results  Component Value Date   HGBA1C 7.1 (A) 12/11/2022   HGBA1C 8.2 (H) 04/16/2022   HGBA1C 7.4 (H) 05/22/2021   Lab Results  Component Value Date   MICROALBUR 17.7 (H) 08/24/2020   LDLCALC 76 11/23/2019   CREATININE 2.24 (H) 02/22/2021   Lab Results  Component Value Date   MICRALBCREAT 10.0 08/24/2020    Lab Results  Component Value Date   FRUCTOSAMINE 280 05/22/2021   FRUCTOSAMINE 325 (H) 02/22/2021   FRUCTOSAMINE 311 (H) 03/29/2020    Office Visit on 12/11/2022  Component Date Value Ref Range Status   Hemoglobin A1C 12/11/2022 7.1 (A)  4.0 - 5.6 % Final    Allergies as of 12/11/2022   No Known Allergies      Medication List        Accurate as of December 11, 2022 10:05 AM. If you have any questions, ask your nurse or doctor.          amLODipine 10 MG tablet Commonly known as: NORVASC Take 1 tablet (10  mg total) by mouth daily.   cloNIDine 0.1 MG tablet Commonly known as: CATAPRES Take 1 tablet (0.1 mg total) by mouth daily.   ezetimibe 10 MG tablet Commonly known as: Zetia Take 1 tablet (10 mg total) by mouth daily.   Farxiga 10 MG Tabs tablet Generic drug: dapagliflozin propanediol Take 10 mg by mouth daily.   FreeStyle Libre 3 Sensor Misc 1 Device by Does not apply route every 14 (fourteen) days. Apply 1 sensor on upper arm every 14 days for continuous glucose monitoring   Garlic 123XX123 MG Caps Take by mouth.   losartan 100 MG tablet Commonly known as: COZAAR Take 1 tablet (100 mg total) by mouth daily.   meloxicam 15 MG tablet Commonly known as: MOBIC Take 1 tablet (15 mg total) by mouth daily.   metoprolol succinate 100 MG 24 hr tablet Commonly known as: TOPROL-XL Take 1 tablet (100 mg total) by mouth daily.   OVER THE COUNTER MEDICATION Korean Ginseng over the counter 500 mg daily   repaglinide 0.5 MG tablet Commonly known as: PRANDIN Take 1 tablet (0.5 mg total) by mouth daily before supper.   rosuvastatin 20 MG tablet Commonly known as: CRESTOR Take 1 tablet (20 mg total) by mouth daily.   Rybelsus 14 MG Tabs Generic drug: Semaglutide Take 1 tablet (14 mg total) by mouth daily.   sildenafil 100 MG tablet Commonly known as: VIAGRA TAKE 1/2 (ONE-HALF) TABLET BY MOUTH ONCE DAILY AS NEEDED   Vitamin D (Ergocalciferol) 1.25 MG (50000 UNIT) Caps capsule Commonly known as: DRISDOL Take 50,000 Units by mouth once a week.        Allergies: No Known Allergies  Past Medical History:  Diagnosis Date   Arthritis    knee left    Chicken pox    Chronic kidney disease    Diabetes mellitus without complication (HCC)    GERD (gastroesophageal reflux disease)    mild    Hyperlipidemia    Hypertension    Sleep apnea    has cpap- weras occ only     Past Surgical History:  Procedure Laterality Date   COLONOSCOPY  05/16/2020   DENTAL SURGERY     with  sedation     Family History  Problem Relation Age of Onset   Hypertension Mother    Hypertension Father    Colon polyps Father    Hypertension Maternal Grandmother    Hypertension Maternal Grandfather  Hypertension Paternal Grandmother    Diabetes Paternal Grandmother    Hypertension Paternal Grandfather    Colon cancer Neg Hx    Esophageal cancer Neg Hx    Rectal cancer Neg Hx    Stomach cancer Neg Hx     Social History:  reports that he has been smoking cigarettes. He has a 5.00 pack-year smoking history. He has never used smokeless tobacco. He reports current alcohol use. He reports that he does not use drugs.   Review of Systems   Lipid history: He was started on Crestor 20 mg when he was not tolerating Lipitor Also has been prescribed Zetia previously but not taking this again He ran out of rosuvastatin prescription a few months ago and did not call for refill  Previously LDL with the combination was below 100, but now his lipids are markedly increased  Triglycerides are high also  Lab Results  Component Value Date   CHOL 348 (H) 04/16/2022   CHOL 271 (H) 08/24/2020   CHOL 141 11/23/2019   Lab Results  Component Value Date   HDL 37.00 (L) 04/16/2022   HDL 34.40 (L) 08/24/2020   HDL 35.00 (L) 11/23/2019   Lab Results  Component Value Date   LDLCALC 76 11/23/2019   LDLCALC 131 (H) 05/18/2019   LDLCALC 111 (H) 02/01/2019   Lab Results  Component Value Date   TRIG 297.0 (H) 04/16/2022   TRIG 264.0 (H) 08/24/2020   TRIG 150.0 (H) 11/23/2019   Lab Results  Component Value Date   CHOLHDL 9 04/16/2022   CHOLHDL 8 08/24/2020   CHOLHDL 4 11/23/2019   Lab Results  Component Value Date   LDLDIRECT 226.0 04/16/2022   LDLDIRECT 112.0 02/22/2021   LDLDIRECT 180.0 08/24/2020            Hypertension: Has been treated with losartan 100 mg, clonidine 0.1, metoprolol 100 mg and amlodipine 10 mg daily On treatment for several years  He is followed by  nephrologist   BP Readings from Last 3 Encounters:  12/11/22 (!) 158/96  04/22/22 (!) 144/102  11/29/21 122/66    Most recent eye exam was in 2/23  Most recent foot exam: 10/2018  Currently known complications of diabetes: Erectile dysfunction  Chronic kidney disease: He was told his renal function is slightly worse now   Lab Results  Component Value Date   CREATININE 2.24 (H) 02/22/2021   CREATININE 2.17 (H) 08/24/2020   CREATININE 2.65 (H) 03/29/2020   Reportedly has fatty liver with persistently high liver functions, last better than before  Lab Results  Component Value Date   ALT 42 02/22/2021     LABS:  Office Visit on 12/11/2022  Component Date Value Ref Range Status   Hemoglobin A1C 12/11/2022 7.1 (A)  4.0 - 5.6 % Final    Physical Examination:  BP (!) 158/96   Pulse 70   Ht 5\' 8"  (1.727 m)   Wt 205 lb (93 kg)   SpO2 96%   BMI 31.17 kg/m       ASSESSMENT:  Diabetes type 2, and obesity  See history of present illness for detailed discussion of current diabetes management, blood sugar patterns and problems identified  His A1c is generally better at 7.1  Current regimen is 7 mg Rybelsus and Prandin intermittently  Blood sugar being assessed with the freestyle libre 3 His lifestyle management was discussed above  HYPERTENSION: Blood pressure is still not controlled, to be followed by nephrology  LIPIDS: Has been  prescribed Crestor and Zetia   PLAN:    Needs more regular follow-up  RYBELSUS will be increased to 14 mg, new prescription sent Discussed blood sugar target before and after meals He will leave off the Prandin as he does not feel to have much rise in blood sugars after meal However needs to make sure he has some protein with breakfast and not excessive carbohydrate  Needs to do more walking or other activities for exercise Continue Crestor and Zetia   Follow-up lipids today   There are no Patient Instructions on file for  this visit.       Elayne Snare 12/11/2022, 10:05 AM   Note: This office note was prepared with Dragon voice recognition system technology. Any transcriptional errors that result from this process are unintentional.  Addendum: LDL is 138, will refill his medications as he appears to be out of refills, last refill was in November

## 2022-12-12 ENCOUNTER — Encounter: Payer: Self-pay | Admitting: Endocrinology

## 2023-01-06 ENCOUNTER — Other Ambulatory Visit: Payer: Self-pay | Admitting: Family

## 2023-03-07 ENCOUNTER — Other Ambulatory Visit: Payer: Self-pay | Admitting: Endocrinology

## 2023-03-07 DIAGNOSIS — E1165 Type 2 diabetes mellitus with hyperglycemia: Secondary | ICD-10-CM

## 2023-03-07 DIAGNOSIS — E782 Mixed hyperlipidemia: Secondary | ICD-10-CM

## 2023-03-13 ENCOUNTER — Other Ambulatory Visit: Payer: BC Managed Care – PPO

## 2023-03-17 ENCOUNTER — Ambulatory Visit: Payer: BC Managed Care – PPO | Admitting: Endocrinology

## 2023-07-05 ENCOUNTER — Other Ambulatory Visit: Payer: Self-pay | Admitting: Family

## 2023-07-24 ENCOUNTER — Encounter: Payer: Self-pay | Admitting: Cardiovascular Disease

## 2023-07-24 ENCOUNTER — Ambulatory Visit: Payer: BC Managed Care – PPO | Attending: Cardiovascular Disease | Admitting: Cardiovascular Disease

## 2023-07-24 VITALS — BP 160/110 | HR 56 | Ht 66.0 in | Wt 202.4 lb

## 2023-07-24 DIAGNOSIS — N1832 Chronic kidney disease, stage 3b: Secondary | ICD-10-CM | POA: Diagnosis not present

## 2023-07-24 DIAGNOSIS — I10 Essential (primary) hypertension: Secondary | ICD-10-CM | POA: Diagnosis not present

## 2023-07-24 DIAGNOSIS — I5020 Unspecified systolic (congestive) heart failure: Secondary | ICD-10-CM | POA: Diagnosis not present

## 2023-07-24 MED ORDER — SILDENAFIL CITRATE 100 MG PO TABS: 50.0000 mg | ORAL_TABLET | Freq: Every day | ORAL | 3 refills | Status: DC | PRN

## 2023-07-24 MED ORDER — AMLODIPINE BESYLATE 10 MG PO TABS: 10.0000 mg | ORAL_TABLET | Freq: Every day | ORAL | 3 refills | Status: DC

## 2023-07-24 NOTE — Patient Instructions (Addendum)
Medication Instructions:  Your physician recommends that you continue on your current medications as directed. Please refer to the Current Medication list given to you today.   I have sent in your Amlodipine to [facility], per Dr. Excell Seltzer, pick it up on your way home and take one then, then take 1 tablet daily  *If you need a refill on your cardiac medications before your next appointment, please call your pharmacy*   Lab Work: None ordered  If you have labs (blood work) drawn today and your tests are completely normal, you will receive your results only by: MyChart Message (if you have MyChart) OR A paper copy in the mail If you have any lab test that is abnormal or we need to change your treatment, we will call you to review the results.   Testing/Procedures: Your physician has requested that you have an echocardiogram. Echocardiography is a painless test that uses sound waves to create images of your heart. It provides your doctor with information about the size and shape of your heart and how well your heart's chambers and valves are working. This procedure takes approximately one hour. There are no restrictions for this procedure. Please do NOT wear cologne, perfume, aftershave, or lotions (deodorant is allowed). Please arrive 15 minutes prior to your appointment time.  Please note: We ask at that you not bring children with you during ultrasound (echo/ vascular) testing. Due to room size and safety concerns, children are not allowed in the ultrasound rooms during exams. Our front office staff cannot provide observation of children in our lobby area while testing is being conducted. An adult accompanying a patient to their appointment will only be allowed in the ultrasound room at the discretion of the ultrasound technician under special circumstances. We apologize for any inconvenience.    Follow-Up: At [facility], you and your health needs are our priority.  As part of our  continuing mission to provide you with exceptional heart care, we have created designated Provider Care Teams.  These Care Teams include your primary Cardiologist (physician) and Advanced Practice Providers (APPs -  Physician Assistants and Nurse Practitioners) who all work together to provide you with the care you need, when you need it.  We recommend signing up for the patient portal called "MyChart".  Sign up information is provided on this After Visit Summary.  MyChart is used to connect with patients for Virtual Visits (Telemedicine).  Patients are able to view lab/test results, encounter notes, upcoming appointments, etc.  Non-urgent messages can be sent to your provider as well.   To learn more about what you can do with MyChart, go to [email or web address].    Your next appointment:   12 month(s)  Provider:    Dr. Excell Seltzer  Other Instructions

## 2023-07-24 NOTE — Assessment & Plan Note (Signed)
See below

## 2023-07-24 NOTE — Progress Notes (Signed)
Cardiology Office Note:    Date:  07/24/2023   ID:  Bryan Webb, DOB 1971/08/20, MRN [id number]  PCP:  Bryan Bass, FNP   [facility] Providers Cardiologist:  Tonny Bollman, MD     Referring MD: Bryan Webb,*   No chief complaint on file.   History of Present Illness:    Bryan Webb is a 52 y.o. male presenting for cardiology follow-up.  He was seen by Dr. Eldridge Dace in the past, and I will be assuming his cardiac care moving forward.  The patient was seen in 2023 for atypical chest pain and he had a stress Myoview scan showing no ischemia, but he was noted to have mildly reduced LVEF of 50%.  He has never had an echo study.  He presents today for cardiology follow-up.  He denies chest pain, chest pressure, or shortness of breath.  He states that today's blood pressure readings are high for him.  He recently was seen and reports readings of 138/84.  He has taken his antihypertensive medicines today and these include clonidine, losartan, and metoprolol succinate.  He thinks that he ran out of amlodipine and knows that he has not taken it this morning.  He is followed by nephrology and endocrinology on a regular basis.  He denies headache, blurry vision, or leg swelling.   Current Medications: Current Meds  Medication Sig   cloNIDine (CATAPRES) 0.1 MG tablet Take 1 tablet (0.1 mg total) by mouth daily.   Continuous Blood Gluc Sensor (FREESTYLE LIBRE 3 SENSOR) MISC 1 Device by Does not apply route every 14 (fourteen) days. Apply 1 sensor on upper arm every 14 days for continuous glucose monitoring   ezetimibe (ZETIA) 10 MG tablet Take 1 tablet (10 mg total) by mouth daily.   Garlic 1000 MG CAPS Take by mouth.   losartan (COZAAR) 100 MG tablet Take 1 tablet (100 mg total) by mouth daily.   meloxicam (MOBIC) 15 MG tablet Take 1 tablet (15 mg total) by mouth daily.   metoprolol succinate (TOPROL-XL) 100 MG 24 hr tablet Take 1 tablet (100 mg total) by mouth  daily.   OVER THE COUNTER MEDICATION Korean Ginseng over the counter 500 mg daily   rosuvastatin (CRESTOR) 20 MG tablet Take 1 tablet (20 mg total) by mouth daily.   Semaglutide (RYBELSUS) 14 MG TABS Take 1 tablet (14 mg total) by mouth daily.   [DISCONTINUED] amLODipine (NORVASC) 10 MG tablet Take 1 tablet (10 mg total) by mouth daily.   [DISCONTINUED] FARXIGA 10 MG TABS tablet Take 10 mg by mouth daily.   [DISCONTINUED] sildenafil (VIAGRA) 100 MG tablet TAKE 1/2 (ONE-HALF) TABLET BY MOUTH ONCE DAILY AS NEEDED   [DISCONTINUED] Vitamin D, Ergocalciferol, (DRISDOL) 1.25 MG (50000 UNIT) CAPS capsule Take 50,000 Units by mouth once a week.     Allergies:   Patient has no known allergies.   ROS:   Please see the history of present illness.    All other systems reviewed and are negative.  EKGs/Labs/Other Studies Reviewed:    The following studies were reviewed today: Cardiac Studies & Procedures     STRESS TESTS  MYOCARDIAL PERFUSION IMAGING 12/12/2021  Narrative   Findings are consistent with no prior ischemia. The study is low risk.   No ST deviation was noted.   Left ventricular function is normal. Nuclear stress EF: 50 %. The left ventricular ejection fraction is mildly decreased (45-54%). End diastolic cavity size is normal. End systolic cavity size  is normal.   Prior study not available for comparison.  No ischemia. LVEF 50% with normal wall motion. Poorly controlled hypertension at the onset and during the study. Overall low risk study. No prior for comparison.              EKG:   EKG Interpretation Date/Time:  Thursday July 24 2023 15:37:54 EST Ventricular Rate:  56 PR Interval:  176 QRS Duration:  76 QT Interval:  418 QTC Calculation: 403 R Axis:   -27  Text Interpretation: Sinus bradycardia Minimal voltage criteria for LVH, may be normal variant ( R in aVL ) ST & T wave abnormality, consider inferior ischemia ST & T wave abnormality, consider anterolateral  ischemia When compared with ECG of 02-Sep-2018 14:10, PREVIOUS ECG IS PRESENTST-T changes are new and are likely related to LVH Confirmed by Tonny Bollman [phone]) on 07/24/2023 5:32:55 PM    Recent Labs: 12/11/2022: ALT 51; BUN 35; Creatinine, Ser 2.60; Potassium 4.5; Sodium 136  Recent Lipid Panel    Component Value Date/Time   CHOL 210 (H) 12/11/2022 1010   TRIG 162.0 (H) 12/11/2022 1010   HDL 40.00 12/11/2022 1010   CHOLHDL 5 12/11/2022 1010   VLDL 32.4 12/11/2022 1010   LDLCALC 138 (H) 12/11/2022 1010   LDLDIRECT 226.0 04/16/2022 1529            Physical Exam:    VS:  BP (!) 160/110 Comment: Right arm 150/110  Pulse (!) 56   Ht 5\' 6"  (1.676 m)   Wt 202 lb 6.4 oz (91.8 kg)   SpO2 98%   BMI 32.67 kg/m     Wt Readings from Last 3 Encounters:  07/24/23 202 lb 6.4 oz (91.8 kg)  12/11/22 205 lb (93 kg)  04/22/22 199 lb 9.6 oz (90.5 kg)     GEN:  Well nourished, well developed in no acute distress HEENT: Normal NECK: No JVD; No carotid bruits LYMPHATICS: No lymphadenopathy CARDIAC: RRR, no murmurs, rubs, gallops RESPIRATORY:  Clear to auscultation without rales, wheezing or rhonchi  ABDOMEN: Soft, non-tender, non-distended MUSCULOSKELETAL:  No edema; No deformity  SKIN: Warm and dry NEUROLOGIC:  Alert and oriented x 3 PSYCHIATRIC:  Normal affect   Assessment & Plan Essential hypertension See below Malignant hypertension The patient has uncontrolled hypertension.  I repeated his blood pressure and got a reading of 170/110.  He is asymptomatic and I suspect he has chronically uncontrolled hypertension.  I wrote him a prescription for amlodipine 10 mg daily to add to his other medicines.  I advised him to take it today when he stops at the pharmacy on his way home and then started on it tomorrow morning on a daily basis.  He otherwise will continue on a combination of clonidine, losartan, and metoprolol succinate.  He has close follow-up with nephrology, endocrinology,  and primary care. Stage 3b chronic kidney disease (HCC) We discussed the impact of uncontrolled hypertension on his kidney dysfunction.  We discussed his risk of developing end-stage renal disease in the setting of diabetes and uncontrolled hypertension.  He understands the importance of controlling his blood pressure better. HFrEF (heart failure with reduced ejection fraction) (HCC) By the patient has no symptoms of heart failure, he has LVH on his EKG and had a reduced ejection fraction on his nuclear scan.  I think an echocardiogram would be helpful to better assess his LVEF, help guide his antihypertensive regimen, and evaluate for severity of hypertensive heart disease.  Medication Adjustments/Labs and Tests Ordered: Current medicines are reviewed at length with the patient today.  Concerns regarding medicines are outlined above.  Orders Placed This Encounter  Procedures   EKG 12-Lead   ECHOCARDIOGRAM COMPLETE   Meds ordered this encounter  Medications   sildenafil (VIAGRA) 100 MG tablet    Sig: Take 0.5 tablets (50 mg total) by mouth daily as needed for erectile dysfunction.    Dispense:  10 tablet    Refill:  3    Last refill without OV   amLODipine (NORVASC) 10 MG tablet    Sig: Take 1 tablet (10 mg total) by mouth daily.    Dispense:  90 tablet    Refill:  3    Patient Instructions  Medication Instructions:  Your physician recommends that you continue on your current medications as directed. Please refer to the Current Medication list given to you today.   I have sent in your Amlodipine to [facility], per Dr. Excell Seltzer, pick it up on your way home and take one then, then take 1 tablet daily  *If you need a refill on your cardiac medications before your next appointment, please call your pharmacy*   Lab Work: None ordered  If you have labs (blood work) drawn today and your tests are completely normal, you will receive your results only by: MyChart Message (if  you have MyChart) OR A paper copy in the mail If you have any lab test that is abnormal or we need to change your treatment, we will call you to review the results.   Testing/Procedures: Your physician has requested that you have an echocardiogram. Echocardiography is a painless test that uses sound waves to create images of your heart. It provides your doctor with information about the size and shape of your heart and how well your heart's chambers and valves are working. This procedure takes approximately one hour. There are no restrictions for this procedure. Please do NOT wear cologne, perfume, aftershave, or lotions (deodorant is allowed). Please arrive 15 minutes prior to your appointment time.  Please note: We ask at that you not bring children with you during ultrasound (echo/ vascular) testing. Due to room size and safety concerns, children are not allowed in the ultrasound rooms during exams. Our front office staff cannot provide observation of children in our lobby area while testing is being conducted. An adult accompanying a patient to their appointment will only be allowed in the ultrasound room at the discretion of the ultrasound technician under special circumstances. We apologize for any inconvenience.    Follow-Up: At [facility], you and your health needs are our priority.  As part of our continuing mission to provide you with exceptional heart care, we have created designated Provider Care Teams.  These Care Teams include your primary Cardiologist (physician) and Advanced Practice Providers (APPs -  Physician Assistants and Nurse Practitioners) who all work together to provide you with the care you need, when you need it.  We recommend signing up for the patient portal called "MyChart".  Sign up information is provided on this After Visit Summary.  MyChart is used to connect with patients for Virtual Visits (Telemedicine).  Patients are able to view lab/test results,  encounter notes, upcoming appointments, etc.  Non-urgent messages can be sent to your provider as well.   To learn more about what you can do with MyChart, go to [email or web address].    Your next appointment:   12 month(s)  Provider:  Dr. Excell Seltzer  Other Instructions     Signed, Tonny Bollman, MD  07/24/2023 5:35 PM    [facility]

## 2023-09-02 ENCOUNTER — Telehealth: Payer: Self-pay | Admitting: Cardiovascular Disease

## 2023-09-02 NOTE — Telephone Encounter (Signed)
Patient dropped off a work clearance form for Dr. Earmon Phoenix review and sign-off to fax back to the patient's employer.  I have place this document, along with an ROI form, in a red folder in Dr. Earmon Phoenix mail box.  Thank you.

## 2023-09-03 ENCOUNTER — Other Ambulatory Visit (HOSPITAL_COMMUNITY): Payer: BC Managed Care – PPO

## 2023-09-04 NOTE — Telephone Encounter (Signed)
Occupational job demands form retrieved from box, signed by [employer]. Faxed to number provided with confirmation received. Called and spoke with patient who wants to pick up form here on Monday when he comes in for appt. Form placed up front for him.

## 2023-09-05 ENCOUNTER — Telehealth: Payer: Self-pay | Admitting: Cardiovascular Disease

## 2023-09-05 NOTE — Telephone Encounter (Signed)
Called and spoke with patient to inform him that the paper was signed saying that there wasn't any restrictions. He states that his work also needs a letter since the paper that was signed was just a suggestion on possible restrictions. Must have something stating he's not under any restrictions. Letter typed and pt coming to pick up today.

## 2023-09-05 NOTE — Telephone Encounter (Signed)
Pt said his work received papers but some of them were not legible and it didn't have restrictions. Pt would like a call back.

## 2023-09-08 ENCOUNTER — Ambulatory Visit (HOSPITAL_COMMUNITY): Payer: BC Managed Care – PPO | Attending: Cardiovascular Disease

## 2023-09-08 DIAGNOSIS — I5020 Unspecified systolic (congestive) heart failure: Secondary | ICD-10-CM | POA: Insufficient documentation

## 2023-09-08 DIAGNOSIS — N1832 Chronic kidney disease, stage 3b: Secondary | ICD-10-CM | POA: Diagnosis not present

## 2023-09-08 DIAGNOSIS — I10 Essential (primary) hypertension: Secondary | ICD-10-CM | POA: Diagnosis not present

## 2023-09-08 LAB — ECHOCARDIOGRAM COMPLETE
AR max vel: 2.47 cm2
AV Area VTI: 2.48 cm2
AV Area mean vel: 2.38 cm2
AV Mean grad: 3 mm[Hg]
AV Peak grad: 6.3 mm[Hg]
Ao pk vel: 1.26 m/s
Area-P 1/2: 2.96 cm2
S' Lateral: 2.7 cm

## 2024-01-09 NOTE — Telephone Encounter (Signed)
 Letter has been discarded since patient hasn't picked it from our office.

## 2024-02-12 ENCOUNTER — Telehealth: Payer: Self-pay | Admitting: Cardiovascular Disease

## 2024-02-12 ENCOUNTER — Telehealth: Payer: Self-pay | Admitting: Family

## 2024-02-12 NOTE — Telephone Encounter (Signed)
 Copied from CRM [id number]. Topic: General - Other >> Feb 12, 2024 12:33 PM Taleah C wrote: Reason for CRM: [name] from [employer] [facility] called to let Bryan Webb know that they sent the death certificate to the office. His callback number is [phone] [facility] # [id number].

## 2024-02-12 NOTE — Telephone Encounter (Signed)
 Returned call to [employer] cabin crew and [employer].  Advised Bryan Webb that patient has not been seen by PCP in over 2 years.  Mr Bryan Webb did not have information regarding death with the exception of "they found him in his apartment".  Encouraged Mr Bryan Webb to contact patients cardiologist, name and number provided.

## 2024-02-12 NOTE — Telephone Encounter (Signed)
 [facility]eral home calling to see if Dr Bryan Webb will sign death Certificate. Pt was found deceased in his apartment on  2024-03-05. Please advise

## 2024-02-12 NOTE — Telephone Encounter (Signed)
 I have not seen this patient in over 2 years. I do not feel comfortable signing his death certificate. Can we reach out to the funeral home to see if we can get further information? I am not sure what options we have with a case like this?

## 2024-02-13 NOTE — Telephone Encounter (Signed)
 Called and spoke to [place] at [facility] [facility] [employer]. PCP had not seen patient in 2 years and states they didn't feel comfortable signing his death certificate and advised funeral home to reach out to cardiology since we are the last ones that actually assessed him in person. Chart doesn't reflect any other provider visits. Will route back to [place].

## 2024-02-15 DEATH — deceased
